# Patient Record
Sex: Female | Born: 1945 | Race: White | Hispanic: No | Marital: Married | Smoking: Never smoker
Health system: Southern US, Community
[De-identification: ages and names within clinical notes are randomized; demographics above are authoritative.]

## PROBLEM LIST (undated history)

## (undated) DIAGNOSIS — M1990 Unspecified osteoarthritis, unspecified site: Secondary | ICD-10-CM

## (undated) DIAGNOSIS — E785 Hyperlipidemia, unspecified: Secondary | ICD-10-CM

## (undated) DIAGNOSIS — H269 Unspecified cataract: Secondary | ICD-10-CM

## (undated) DIAGNOSIS — I10 Essential (primary) hypertension: Secondary | ICD-10-CM

## (undated) DIAGNOSIS — M8580 Other specified disorders of bone density and structure, unspecified site: Secondary | ICD-10-CM

## (undated) DIAGNOSIS — M810 Age-related osteoporosis without current pathological fracture: Secondary | ICD-10-CM

## (undated) HISTORY — DX: Age-related osteoporosis without current pathological fracture: M81.0

## (undated) HISTORY — DX: Other specified disorders of bone density and structure, unspecified site: M85.80

## (undated) HISTORY — DX: Unspecified osteoarthritis, unspecified site: M19.90

## (undated) HISTORY — PX: EYE SURGERY: SHX253

## (undated) HISTORY — PX: CATARACT EXTRACTION: SUR2

## (undated) HISTORY — DX: Unspecified cataract: H26.9

## (undated) HISTORY — PX: TUBAL LIGATION: SHX77

## (undated) HISTORY — DX: Essential (primary) hypertension: I10

## (undated) HISTORY — DX: Hyperlipidemia, unspecified: E78.5

---

## 1999-08-08 ENCOUNTER — Other Ambulatory Visit: Admission: RE | Admit: 1999-08-08 | Discharge: 1999-08-08 | Payer: Self-pay | Admitting: Obstetrics and Gynecology

## 1999-08-08 ENCOUNTER — Encounter (INDEPENDENT_AMBULATORY_CARE_PROVIDER_SITE_OTHER): Payer: Self-pay | Admitting: Specialist

## 2001-10-30 ENCOUNTER — Other Ambulatory Visit: Admission: RE | Admit: 2001-10-30 | Discharge: 2001-10-30 | Payer: Self-pay | Admitting: Obstetrics and Gynecology

## 2002-11-10 ENCOUNTER — Other Ambulatory Visit: Admission: RE | Admit: 2002-11-10 | Discharge: 2002-11-10 | Payer: Self-pay | Admitting: Obstetrics and Gynecology

## 2003-11-26 ENCOUNTER — Other Ambulatory Visit: Admission: RE | Admit: 2003-11-26 | Discharge: 2003-11-26 | Payer: Self-pay | Admitting: Obstetrics and Gynecology

## 2004-12-07 ENCOUNTER — Other Ambulatory Visit: Admission: RE | Admit: 2004-12-07 | Discharge: 2004-12-07 | Payer: Self-pay | Admitting: Obstetrics and Gynecology

## 2005-08-23 ENCOUNTER — Ambulatory Visit: Payer: Self-pay | Admitting: Internal Medicine

## 2005-09-03 ENCOUNTER — Ambulatory Visit: Payer: Self-pay | Admitting: Internal Medicine

## 2008-09-22 ENCOUNTER — Ambulatory Visit: Payer: Self-pay | Admitting: Family Medicine

## 2010-06-07 ENCOUNTER — Telehealth: Payer: Self-pay | Admitting: Family Medicine

## 2010-06-20 ENCOUNTER — Ambulatory Visit: Payer: Self-pay | Admitting: Family Medicine

## 2010-08-16 ENCOUNTER — Encounter: Payer: Self-pay | Admitting: Family Medicine

## 2010-08-21 ENCOUNTER — Encounter (INDEPENDENT_AMBULATORY_CARE_PROVIDER_SITE_OTHER): Payer: Self-pay | Admitting: *Deleted

## 2010-10-12 ENCOUNTER — Ambulatory Visit
Admission: RE | Admit: 2010-10-12 | Discharge: 2010-10-12 | Payer: Self-pay | Source: Home / Self Care | Attending: Family Medicine | Admitting: Family Medicine

## 2010-11-07 NOTE — Miscellaneous (Signed)
Summary: Immunization Entry   Immunization History:  Influenza Immunization History:    Influenza:  historical @ [facility] (08/16/2010)

## 2010-11-07 NOTE — Assessment & Plan Note (Signed)
Summary: SHINGLES SHOT///SPH  Nurse Visit   Allergies: No Known Drug Allergies  Immunizations Administered:  Zostavax # 1:    Vaccine Type: Zostavax    Site: right deltoid    Mfr: Merck    Dose: 0.5 ml    Route: [place]    Given by: Almeta Monas CMA (AAMA)    Exp. Date: 05/26/2011    Lot #: [id number]    VIS given: 07/20/05 given June 20, 2010.  Orders Added: 1)  Zoster (Shingles) Vaccine Live [90736] 2)  Admin 1st Vaccine [phone]

## 2010-11-07 NOTE — Progress Notes (Signed)
Summary: Rx for Shingles Shot  Phone Note Call from Patient Call back at Home Phone [phone]   Caller: Patient Summary of Call: pt is requesting an rx for a shingles shot. [facility].  Initial call taken by: Lavell Islam,  June 07, 2010 4:14 PM  Follow-up for Phone Call        please advise. Lucious Groves CMA  June 08, 2010 9:27 AM   Additional Follow-up for Phone Call Additional follow up Details #1::        sent to pharmacy Additional Follow-up by: Loreen Freud DO,  June 08, 2010 10:20 AM    Additional Follow-up for Phone Call Additional follow up Details #2::    Patient spouse notified that prescription has been taken care of. Follow-up by: Lucious Groves CMA,  June 08, 2010 11:08 AM  New/Updated Medications: ZOSTAVAX 09811 UNT/0.65ML SOLR (ZOSTER VACCINE LIVE) 1 ml IM x1 Prescriptions: ZOSTAVAX 91478 UNT/0.65ML SOLR (ZOSTER VACCINE LIVE) 1 ml IM x1  #1 x 0   Entered and Authorized by:   Loreen Freud DO   Signed by:   Loreen Freud DO on 06/08/2010   Method used:   Electronically to        [id number]* (retail)       [address]       [place], [place]  [address]       Ph: [phone]       Fax: [phone]   RxID:   [id number]

## 2010-11-07 NOTE — Miscellaneous (Signed)
Summary: [facility]  [facility]   Imported By: Lanelle Bal 08/29/2010 10:20:45  _____________________________________________________________________  External Attachment:    Type:   Image     Comment:   External Document

## 2010-11-09 NOTE — Assessment & Plan Note (Signed)
Summary: sore tendon-left shoulder-///sph   Vital Signs:  Patient profile:   65 year old female Height:      63 inches Weight:      134.4 pounds BMI:     23.89 Pulse rate:   72 / minute Pulse rhythm:   regular BP sitting:   108 / 66  (right arm) Cuff size:   regular  Vitals Entered By: Almeta Monas CMA Duncan Dull) (October 12, 2010 11:30 AM) CC: c/o sore left shoulder tendon   History of Present Illness: Pt here c/o L arm pain since October after moving some suitcases on a trip.   No other injuries.  No other symptoms.      Current Medications (verified): 1)  Zostavax 16109 Unt/0.87ml Solr (Zoster Vaccine Live) .Marland Kitchen.. 1 Ml Im X1 2)  Mobic 15 Mg Tabs (Meloxicam) .Marland Kitchen.. 1 By Mouth Once Daily As Needed Pain  Allergies (verified): No Known Drug Allergies  Review of Systems      See HPI  Physical Exam  General:  Well-developed,well-nourished,in no acute distress; alert,appropriate and cooperative throughout examination Msk:  L shoulder--pain with abduction past 90 degrees but pt is able to do it no swelling + tenderness lat upper arm --deltoid Extremities:  No clubbing, cyanosis, edema, or deformity noted with normal full range of motion of all joints.   Psych:  Oriented X3 and normally interactive.     Impression & Recommendations:  Problem # 1:  SHOULDER PAIN, LEFT (ICD-719.41)  The following medications were removed from the medication list:    Mobic 15 Mg Tabs (Meloxicam) .Marland Kitchen... 1 by mouth once daily prn Her updated medication list for this problem includes:    Mobic 15 Mg Tabs (Meloxicam) .Marland Kitchen... 1 by mouth once daily as needed pain  Discussed shoulder exercises, use of moist heat or ice, and medication.   Complete Medication List: 1)  Zostavax 60454 Unt/0.71ml Solr (Zoster vaccine live) .Marland Kitchen.. 1 ml im x1 2)  Mobic 15 Mg Tabs (Meloxicam) .Marland Kitchen.. 1 by mouth once daily as needed pain  Patient Instructions: 1)  RTO Cpe Prescriptions: MOBIC 15 MG TABS (MELOXICAM) 1 by mouth  once daily as needed pain  #30 x 2   Entered and Authorized by:   Loreen Freud DO   Signed by:   Loreen Freud DO on 10/12/2010   Method used:   Electronically to        [id number]* (retail)       [address]       [place], [place]  [address]       Ph: [phone]       Fax: [phone]   RxID:   [id number]    Orders Added: 1)  Est. Patient Level III [66440]

## 2011-04-16 ENCOUNTER — Other Ambulatory Visit: Payer: Self-pay | Admitting: Family Medicine

## 2011-04-16 NOTE — Telephone Encounter (Signed)
Per [facility] pt has been seen for acute items. I do not see a CPX. Please advise.

## 2011-04-17 NOTE — Telephone Encounter (Signed)
Refill x1 only Pt needs ov/ cpe

## 2011-04-17 NOTE — Telephone Encounter (Signed)
Letter mailed     KP 

## 2011-08-20 ENCOUNTER — Encounter: Payer: Self-pay | Admitting: Family Medicine

## 2011-08-27 ENCOUNTER — Encounter: Payer: Self-pay | Admitting: Family Medicine

## 2011-08-28 ENCOUNTER — Ambulatory Visit (INDEPENDENT_AMBULATORY_CARE_PROVIDER_SITE_OTHER): Payer: BC Managed Care – PPO | Admitting: Family Medicine

## 2011-08-28 ENCOUNTER — Encounter: Payer: Self-pay | Admitting: Family Medicine

## 2011-08-28 VITALS — BP 108/70 | HR 68 | Temp 98.2°F | Ht 67.0 in | Wt 131.4 lb

## 2011-08-28 DIAGNOSIS — Z23 Encounter for immunization: Secondary | ICD-10-CM

## 2011-08-28 DIAGNOSIS — Z0000 Encounter for general adult medical examination without abnormal findings: Secondary | ICD-10-CM

## 2011-08-28 LAB — TSH: TSH: 0.92 u[IU]/mL (ref 0.35–5.50)

## 2011-08-28 LAB — LIPID PANEL: Triglycerides: 94 mg/dL (ref 0.0–149.0)

## 2011-08-28 LAB — HEPATIC FUNCTION PANEL
ALT: 14 U/L (ref 0–35)
AST: 16 U/L (ref 0–37)
Alkaline Phosphatase: 48 U/L (ref 39–117)
Bilirubin, Direct: 0 mg/dL (ref 0.0–0.3)
Total Protein: 7.8 g/dL (ref 6.0–8.3)

## 2011-08-28 LAB — POCT URINALYSIS DIPSTICK
Bilirubin, UA: NEGATIVE
Clarity, UA: NEGATIVE
Glucose, UA: NEGATIVE
Ketones, UA: NEGATIVE
Leukocytes, UA: NEGATIVE
Nitrite, UA: NEGATIVE

## 2011-08-28 LAB — CBC WITH DIFFERENTIAL/PLATELET
Basophils Relative: 0.3 % (ref 0.0–3.0)
Eosinophils Relative: 2.4 % (ref 0.0–5.0)
HCT: 41.3 % (ref 36.0–46.0)
MCV: 93.9 fl (ref 78.0–100.0)
Monocytes Absolute: 0.7 10*3/uL (ref 0.1–1.0)
Monocytes Relative: 7.8 % (ref 3.0–12.0)
Neutrophils Relative %: 61.9 % (ref 43.0–77.0)
RBC: 4.4 Mil/uL (ref 3.87–5.11)
WBC: 9.2 10*3/uL (ref 4.5–10.5)

## 2011-08-28 LAB — BASIC METABOLIC PANEL
Chloride: 106 mEq/L (ref 96–112)
Creatinine, Ser: 0.7 mg/dL (ref 0.4–1.2)
Potassium: 3.6 mEq/L (ref 3.5–5.1)

## 2011-08-28 NOTE — Progress Notes (Signed)
  Subjective:     Tonya Sheppard is a 65 y.o. female and is here for a comprehensive physical exam. The patient reports no problems.  History   Social History  . Marital Status: Single    Spouse Name: N/A    Number of Children: N/A  . Years of Education: N/A   Occupational History  . Not on file.   Social History Main Topics  . Smoking status: Never Smoker   . Smokeless tobacco: Never Used  . Alcohol Use: No  . Drug Use: No  . Sexually Active: Not on file   Other Topics Concern  . Not on file   Social History Narrative  . No narrative on file   Health Maintenance  Topic Date Due  . Pap Smear  09/26/1964  . Tetanus/tdap  09/26/1965  . Mammogram  09/26/1996  . Colonoscopy  09/26/1996  . Influenza Vaccine  07/08/2012  . Zostavax  Completed    The following portions of the patient's history were reviewed and updated as appropriate: allergies, current medications, past family history, past medical history, past social history, past surgical history and problem list.  Review of Systems Review of Systems  Constitutional: Negative for activity change, appetite change and fatigue.  HENT: Negative for hearing loss, congestion, tinnitus and ear discharge.  dentist q49m Eyes: Negative for visual disturbance (see optho q2y -- vision corrected to 20/20 with glasses).  Respiratory: Negative for cough, chest tightness and shortness of breath.   Cardiovascular: Negative for chest pain, palpitations and leg swelling.  Gastrointestinal: Negative for abdominal pain, diarrhea, constipation and abdominal distention.  Genitourinary: Negative for urgency, frequency, decreased urine volume and difficulty urinating.  Musculoskeletal: Negative for back pain, arthralgias and gait problem.  Skin: Negative for color change, pallor and rash.  Neurological: Negative for dizziness, light-headedness, numbness and headaches.  Hematological: Negative for adenopathy. Does not bruise/bleed easily.    Psychiatric/Behavioral: Negative for suicidal ideas, confusion, sleep disturbance, self-injury, dysphoric mood, decreased concentration and agitation.       Objective:    BP 108/70  Pulse 68  Temp(Src) 98.2 F (36.8 C) (Oral)  Ht 5\' 7"  (1.702 m)  Wt 131 lb 6.4 oz (59.603 kg)  BMI 20.58 kg/m2  SpO2 98% General appearance: alert, cooperative, appears stated age and no distress Head: Normocephalic, without obvious abnormality, atraumatic Eyes: conjunctivae/corneas clear. PERRL, EOM's intact. Fundi benign. Ears: normal TM's and external ear canals both ears Nose: Nares normal. Septum midline. Mucosa normal. No drainage or sinus tenderness. Throat: lips, mucosa, and tongue normal; teeth and gums normal Neck: no adenopathy, no carotid bruit, no JVD, supple, symmetrical, trachea midline and thyroid not enlarged, symmetric, no tenderness/mass/nodules Back: symmetric, no curvature. ROM normal. No CVA tenderness. Lungs: clear to auscultation bilaterally Breasts: normal appearance, no masses or tenderness Heart: regular rate and rhythm, S1, S2 normal, no murmur, click, rub or gallop Abdomen: soft, non-tender; bowel sounds normal; no masses,  no organomegaly Pelvic: gyn Extremities: extremities normal, atraumatic, no cyanosis or edema Pulses: 2+ and symmetric Skin: Skin color, texture, turgor normal. No rashes or lesions Lymph nodes: Cervical, supraclavicular, and axillary nodes normal. Neurologic: Alert and oriented X 3, normal strength and tone. Normal symmetric reflexes. Normal coordination and gait psych-- aaox3,  no anxiety or depression    Assessment:    Healthy female exam.  Arthritis---mobic prn     Plan:    ghm utd See After Visit Summary for Counseling Recommendations

## 2011-08-28 NOTE — Patient Instructions (Signed)

## 2011-12-25 ENCOUNTER — Other Ambulatory Visit: Payer: Self-pay | Admitting: Family Medicine

## 2011-12-26 NOTE — Telephone Encounter (Signed)
Last seen 08/28/11 and filled 04/16/11 # 30. Please advise   KP

## 2012-01-30 ENCOUNTER — Other Ambulatory Visit: Payer: Self-pay | Admitting: Family Medicine

## 2012-01-30 NOTE — Telephone Encounter (Signed)
Last seen 08/28/2011 and filled 12/25/11 # 30. Please advise    KP

## 2012-02-11 NOTE — Telephone Encounter (Signed)
2nd request:  Meloxicam 15mg  tablet #30. Take 1 tablet by mouth daily with food if needed for pain. Last fill 12-26-11

## 2012-02-11 NOTE — Telephone Encounter (Signed)
Last seen 08/28/11 and filled 12/25/11 # 30. please advise    KP

## 2012-09-17 ENCOUNTER — Encounter: Payer: Self-pay | Admitting: Family Medicine

## 2012-09-17 ENCOUNTER — Ambulatory Visit (HOSPITAL_BASED_OUTPATIENT_CLINIC_OR_DEPARTMENT_OTHER)
Admission: RE | Admit: 2012-09-17 | Discharge: 2012-09-17 | Disposition: A | Discharge status: Home / Self Care | Payer: Medicare Other | Source: Ambulatory Visit | Attending: Family Medicine | Admitting: Family Medicine

## 2012-09-17 ENCOUNTER — Ambulatory Visit (INDEPENDENT_AMBULATORY_CARE_PROVIDER_SITE_OTHER): Payer: BC Managed Care – PPO | Admitting: Family Medicine

## 2012-09-17 VITALS — BP 160/90 | HR 63 | Temp 97.6°F | Ht 67.0 in | Wt 134.2 lb

## 2012-09-17 DIAGNOSIS — M25569 Pain in unspecified knee: Secondary | ICD-10-CM

## 2012-09-17 MED ORDER — MELOXICAM 15 MG PO TABS: 15.0000 mg | ORAL_TABLET | Freq: Every day | ORAL | Status: DC

## 2012-09-17 NOTE — Assessment & Plan Note (Signed)
Due to pt's report of seeing/feeling a cord and recent long car ride will get [place] to r/o DVT.  If [place] negative, pt's pain is most likely soft tissue as she has minimal bony/joint tenderness.  Start scheduled NSAIDs, tylenol prn as this provided good relief, ice.  If no improvement in 1 week, will refer to ortho.  Reviewed supportive care and red flags that should prompt return.  Pt expressed understanding and is in agreement w/ plan.

## 2012-09-17 NOTE — Progress Notes (Signed)
  Subjective:    Patient ID: Tonya Sheppard, female    DOB: 1946-09-28, 66 y.o.   MRN: [id number]  HPI Knee pain- L knee, started acutely yesterday.  Has had mild discomfort ~1 month.  Woke up yesterday and 'was ok' and then 'it was all of a sudden, oh my god'.  Pain is a burning pain.  Pain is medial and inferior to knee cap.  Pain is worse w/ weight bearing.  Painful to touch- 'the seam on my pjs made it worse'.  Pain improved w/ tylenol.  Painful to bend knee.  Area appears to be bruised.  No known injury but had car ride w/ leg in stationary position.  Has never had gout.   Review of Systems For ROS see HPI     Objective:   Physical Exam  Vitals reviewed. Constitutional: She is oriented to person, place, and time. She appears well-developed and well-nourished.       Obviously uncomfortable  Musculoskeletal:       Left knee: She exhibits swelling. She exhibits normal range of motion, no effusion, no ecchymosis, no deformity and no erythema. tenderness found. Medial joint line tenderness noted. No lateral joint line and no patellar tendon tenderness noted.       Legs: Neurological: She is alert and oriented to person, place, and time. She has normal reflexes. Coordination (antalgic gait) normal.          Assessment & Plan:

## 2012-09-17 NOTE — Patient Instructions (Addendum)
We'll notify you of your ultrasound results Start the Mobic daily Continue tylenol as needed ICE! Call if no improvement in 1 week- we'll then send you to ortho Call with any questions or concerns Hang in there!

## 2012-09-19 ENCOUNTER — Other Ambulatory Visit: Payer: Self-pay | Admitting: Family Medicine

## 2012-09-19 ENCOUNTER — Telehealth: Payer: Self-pay | Admitting: Family Medicine

## 2012-09-19 DIAGNOSIS — M79606 Pain in leg, unspecified: Secondary | ICD-10-CM

## 2012-09-19 NOTE — Telephone Encounter (Signed)
Patient made aware and voiced undertsanding.     KP

## 2012-09-19 NOTE — Telephone Encounter (Signed)
Assessment & Plan:    Knee pain - Tonya Rhymes, MD 09/17/2012 4:42 PM Signed  Due to pt's report of seeing/feeling a cord and recent long car ride will get [place] to r/o DVT. If [place] negative, pt's pain is most likely soft tissue as she has minimal bony/joint tenderness. Start scheduled NSAIDs, tylenol prn as this provided good relief, ice. If no improvement in 1 week, will refer to ortho. Reviewed supportive care and red flags that should prompt return. Pt expressed understanding and is in agreement w/ plan.    Patient does not want to wait a week, she would like referral now. Please advise     KP

## 2012-09-19 NOTE — Telephone Encounter (Signed)
Referral in

## 2012-09-19 NOTE — Telephone Encounter (Signed)
Pt does not want to wait for the time period that Lowne told her to see if she got better due to the long wait of getting a referral. So she would like to get that referral now so that she doesn't have to wait so long. pls call pt

## 2012-09-23 ENCOUNTER — Other Ambulatory Visit (INDEPENDENT_AMBULATORY_CARE_PROVIDER_SITE_OTHER): Payer: Medicare Other

## 2012-09-23 DIAGNOSIS — M899 Disorder of bone, unspecified: Secondary | ICD-10-CM

## 2012-09-30 ENCOUNTER — Encounter: Payer: Self-pay | Admitting: *Deleted

## 2012-10-30 ENCOUNTER — Other Ambulatory Visit (INDEPENDENT_AMBULATORY_CARE_PROVIDER_SITE_OTHER): Payer: Medicare Other

## 2012-10-30 DIAGNOSIS — M899 Disorder of bone, unspecified: Secondary | ICD-10-CM

## 2012-10-30 DIAGNOSIS — M949 Disorder of cartilage, unspecified: Secondary | ICD-10-CM

## 2012-10-30 LAB — CREATININE, SERUM: Creatinine, Ser: 0.8 mg/dL (ref 0.4–1.2)

## 2012-10-30 LAB — CALCIUM: Calcium: 9.4 mg/dL (ref 8.4–10.5)

## 2012-11-22 ENCOUNTER — Other Ambulatory Visit: Payer: Self-pay

## 2013-05-13 ENCOUNTER — Other Ambulatory Visit: Payer: Self-pay

## 2013-07-03 LAB — HM MAMMOGRAPHY: HM Mammogram: NEGATIVE

## 2013-07-23 ENCOUNTER — Other Ambulatory Visit: Payer: Self-pay | Admitting: Family Medicine

## 2013-07-28 ENCOUNTER — Ambulatory Visit: Payer: Medicare Other

## 2013-08-13 ENCOUNTER — Other Ambulatory Visit: Payer: Self-pay

## 2013-09-25 ENCOUNTER — Encounter: Payer: Medicare Other | Admitting: Family Medicine

## 2013-12-24 ENCOUNTER — Encounter: Payer: Self-pay | Admitting: Family Medicine

## 2013-12-24 ENCOUNTER — Ambulatory Visit (INDEPENDENT_AMBULATORY_CARE_PROVIDER_SITE_OTHER): Payer: Medicare HMO | Admitting: Family Medicine

## 2013-12-24 VITALS — BP 130/70 | HR 62 | Temp 97.6°F | Ht 67.0 in | Wt 135.6 lb

## 2013-12-24 DIAGNOSIS — Z0000 Encounter for general adult medical examination without abnormal findings: Secondary | ICD-10-CM

## 2013-12-24 DIAGNOSIS — Z136 Encounter for screening for cardiovascular disorders: Secondary | ICD-10-CM

## 2013-12-24 DIAGNOSIS — Z23 Encounter for immunization: Secondary | ICD-10-CM

## 2013-12-24 NOTE — Patient Instructions (Signed)

## 2013-12-24 NOTE — Progress Notes (Signed)
Subjective:    Tonya Sheppard is a 68 y.o. female who presents for Medicare Annual/Subsequent preventive examination.  Preventive Screening-Counseling & Management  Tobacco History  Smoking status  . Never Smoker   Smokeless tobacco  . Never Used     Problems Prior to Visit 1.   Current Problems (verified) There are no active problems to display for this patient.   Medications Prior to Visit Current Outpatient Prescriptions on File Prior to Visit  Medication Sig Dispense Refill  . Calcium Carb-Cholecalciferol (CALCIUM 1000 + D PO) Take 1,000 mg by mouth 2 (two) times daily.      . meloxicam (MOBIC) 15 MG tablet Take 1 tablet (15 mg total) by mouth daily.  30 tablet  3   No current facility-administered medications on file prior to visit.    Current Medications (verified) Current Outpatient Prescriptions  Medication Sig Dispense Refill  . alendronate (FOSAMAX) 70 MG tablet       . Calcium Carb-Cholecalciferol (CALCIUM 1000 + D PO) Take 1,000 mg by mouth 2 (two) times daily.      . meloxicam (MOBIC) 15 MG tablet Take 1 tablet (15 mg total) by mouth daily.  30 tablet  3   No current facility-administered medications for this visit.     Allergies (verified) Review of patient's allergies indicates no known allergies.   PAST HISTORY  Family History Family History  Problem Relation Age of Onset  . Hypertension Mother   . Heart disease Mother   . Heart disease Father 77    MI  . Cancer Sister 78    ovarian  . Hepatitis Brother   . Alcohol abuse Brother   . Alcohol abuse Sister     Social History History  Substance Use Topics  . Smoking status: Never Smoker   . Smokeless tobacco: Never Used  . Alcohol Use: No     Are there smokers in your home (other than you)? No  Risk Factors Current exercise habits: Gym/ health club routine includes cardio and yard work.  Dietary issues discussed: na    Cardiac risk factors: none.  Depression Screen (Note: if  answer to either of the following is "Yes", a more complete depression screening is indicated)   Over the past two weeks, have you felt down, depressed or hopeless? No  Over the past two weeks, have you felt little interest or pleasure in doing things? No  Have you lost interest or pleasure in daily life? No  Do you often feel hopeless? No  Do you cry easily over simple problems? No  Activities of Daily Living In your present state of health, do you have any difficulty performing the following activities?:  Driving? No Managing money?  No Feeding yourself? No Getting from bed to chair? No Climbing a flight of stairs? No Preparing food and eating?: No Bathing or showering? No Getting dressed: No Getting to the toilet? No Using the toilet:No Moving around from place to place: No In the past year have you fallen or had a near fall?:No   Are you sexually active?  Yes  Do you have more than one partner?  No  Hearing Difficulties: No Do you often ask people to speak up or repeat themselves? No Do you experience ringing or noises in your ears? No Do you have difficulty understanding soft or whispered voices? No   Do you feel that you have a problem with memory? No  Do you often misplace items? No  Do you  feel safe at home?  Yes  Cognitive Testing  Alert? Yes  Normal Appearance?Yes  Oriented to person? Yes  Place? Yes   Time? Yes  Recall of three objects?  Yes  Can perform simple calculations? Yes  Displays appropriate judgment?Yes  Can read the correct time from a watch face?Yes   Advanced Directives have been discussed with the patient? Yes  List the Names of Other Physician/Practitioners you currently use: 1.    Indicate any recent Medical Services you may have received from other than [facility] providers in the past year (date may be approximate).  Immunization History  Administered Date(s) Administered  . Influenza Whole 08/16/2010, 07/27/2011  . Influenza-Unspecified  06/08/2013  . Tdap 08/28/2011  . Zoster 06/20/2010    Screening Tests Health Maintenance  Topic Date Due  . Pneumococcal Polysaccharide Vaccine Age 68 And Over  09/27/2011  . Influenza Vaccine  05/08/2014  . Mammogram  07/04/2015  . Colonoscopy  08/27/2016  . Tetanus/tdap  08/27/2021  . Zostavax  Completed    All answers were reviewed with the patient and necessary referrals were made:  Tonya FreudYvonne Lowne, DO   12/24/2013   History reviewed:  She  has no past medical history on file. She  does not have any pertinent problems on file. She  has no past surgical history on file. Her family history includes Alcohol abuse in her brother and sister; Cancer (age of onset: 750) in her sister; Heart disease in her mother; Heart disease (age of onset: 2579) in her father; Hepatitis in her brother; Hypertension in her mother. She  reports that she has never smoked. She has never used smokeless tobacco. She reports that she does not drink alcohol or use illicit drugs. She has a current medication list which includes the following prescription(s): alendronate, calcium carb-cholecalciferol, and meloxicam. Current Outpatient Prescriptions on File Prior to Visit  Medication Sig Dispense Refill  . Calcium Carb-Cholecalciferol (CALCIUM 1000 + D PO) Take 1,000 mg by mouth 2 (two) times daily.      . meloxicam (MOBIC) 15 MG tablet Take 1 tablet (15 mg total) by mouth daily.  30 tablet  3   No current facility-administered medications on file prior to visit.   She has No Known Allergies.  Review of Systems  Review of Systems  Constitutional: Negative for activity change, appetite change and fatigue.  HENT: Negative for hearing loss, congestion, tinnitus and ear discharge.   Eyes: Negative for visual disturbance (see optho q1y -- vision corrected to 20/20 with glasses).  Respiratory: Negative for cough, chest tightness and shortness of breath.   Cardiovascular: Negative for chest pain, palpitations and  leg swelling.  Gastrointestinal: Negative for abdominal pain, diarrhea, constipation and abdominal distention.  Genitourinary: Negative for urgency, frequency, decreased urine volume and difficulty urinating.  Musculoskeletal: Negative for back pain, arthralgias and gait problem.  Skin: Negative for color change, pallor and rash.  Neurological: Negative for dizziness, light-headedness, numbness and headaches.  Hematological: Negative for adenopathy. Does not bruise/bleed easily.  Psychiatric/Behavioral: Negative for suicidal ideas, confusion, sleep disturbance, self-injury, dysphoric mood, decreased concentration and agitation.  Pt is able to read and write and can do all ADLs No risk for falling No abuse/ violence in home     Objective:     Vision by Snellen chart: opth  Body mass index is 21.23 kg/(m^2). BP 130/70  Pulse 62  Temp(Src) 97.6 F (36.4 C) (Oral)  Ht 5\' 7"  (1.702 m)  Wt 135 lb 9.6 oz (61.508  kg)  BMI 21.23 kg/m2  SpO2 98%  BP 130/70  Pulse 62  Temp(Src) 97.6 F (36.4 C) (Oral)  Ht 5\' 7"  (1.702 m)  Wt 135 lb 9.6 oz (61.508 kg)  BMI 21.23 kg/m2  SpO2 98% General appearance: alert, cooperative, appears stated age and no distress Head: Normocephalic, without obvious abnormality, atraumatic Eyes: conjunctivae/corneas clear. PERRL, EOM's intact. Fundi benign. Ears: normal TM's and external ear canals both ears Nose: Nares normal. Septum midline. Mucosa normal. No drainage or sinus tenderness. Throat: lips, mucosa, and tongue normal; teeth and gums normal Neck: no adenopathy, no carotid bruit, no JVD, supple, symmetrical, trachea midline and thyroid not enlarged, symmetric, no tenderness/mass/nodules Back: symmetric, no curvature. ROM normal. No CVA tenderness. Lungs: clear to auscultation bilaterally Breasts: gyn Heart: S1, S2 normal Abdomen: soft, non-tender; bowel sounds normal; no masses,  no organomegaly Pelvic: deferred--gyn Extremities: extremities  normal, atraumatic, no cyanosis or edema Pulses: 2+ and symmetric Skin: Skin color, texture, turgor normal. No rashes or lesions Lymph nodes: Cervical, supraclavicular, and axillary nodes normal. Neurologic: Alert and oriented X 3, normal strength and tone. Normal symmetric reflexes. Normal coordination and gait Psych--normal      Assessment:     cpe      Plan:     During the course of the visit the patient was educated and counseled about appropriate screening and preventive services including:    Pneumococcal vaccine   Influenza vaccine  Td vaccine  Screening mammography  Bone densitometry screening  Colorectal cancer screening  Glaucoma screening  Advanced directives: has an advanced directive - a copy HAS NOT been provided.  Diet review for nutrition referral? Yes ____  Not Indicated x____   Patient Instructions (the written plan) was given to the patient.  Medicare Attestation I have personally reviewed: The patient's medical and social history Their use of alcohol, tobacco or illicit drugs Their current medications and supplements The patient's functional ability including ADLs,fall risks, home safety risks, cognitive, and hearing and visual impairment Diet and physical activities Evidence for depression or mood disorders  The patient's weight, height, BMI, and visual acuity have been recorded in the chart.  I have made referrals, counseling, and provided education to the patient based on review of the above and I have provided the patient with a written personalized care plan for preventive services.     Tonya Freud, DO   12/24/2013   1. Medicare annual wellness visit, subsequent   2. Screening, ischemic heart disease  - Basic metabolic panel; Future - CBC with Differential; Future - Hepatic function panel; Future - Lipid panel; Future - POCT urinalysis dipstick; Future  3. Need for pneumococcal vaccination  - Pneumococcal polysaccharide  vaccine 23-valent greater than or equal to 2yo subcutaneous/IM

## 2013-12-24 NOTE — Progress Notes (Signed)
Pre visit review using our clinic review tool, if applicable. No additional management support is needed unless otherwise documented below in the visit note. 

## 2013-12-31 ENCOUNTER — Other Ambulatory Visit (INDEPENDENT_AMBULATORY_CARE_PROVIDER_SITE_OTHER): Payer: Medicare HMO

## 2013-12-31 DIAGNOSIS — Z136 Encounter for screening for cardiovascular disorders: Secondary | ICD-10-CM

## 2013-12-31 LAB — LIPID PANEL
CHOL/HDL RATIO: 3
Cholesterol: 239 mg/dL — ABNORMAL HIGH (ref 0–200)
HDL: 78.9 mg/dL (ref >39.00)
LDL Cholesterol: 147 mg/dL — ABNORMAL HIGH (ref 0–99)
Triglycerides: 66 mg/dL (ref 0.0–149.0)
VLDL: 13.2 mg/dL (ref 0.0–40.0)

## 2013-12-31 LAB — CBC WITH DIFFERENTIAL/PLATELET
BASOS PCT: 0.6 % (ref 0.0–3.0)
Basophils Absolute: 0 10*3/uL (ref 0.0–0.1)
EOS PCT: 1.7 % (ref 0.0–5.0)
Eosinophils Absolute: 0.1 10*3/uL (ref 0.0–0.7)
HCT: 40 % (ref 36.0–46.0)
HEMOGLOBIN: 13.2 g/dL (ref 12.0–15.0)
LYMPHS PCT: 31.1 % (ref 12.0–46.0)
Lymphs Abs: 2.4 10*3/uL (ref 0.7–4.0)
MCHC: 32.9 g/dL (ref 30.0–36.0)
MCV: 93.2 fl (ref 78.0–100.0)
MONOS PCT: 8.5 % (ref 3.0–12.0)
Monocytes Absolute: 0.7 10*3/uL (ref 0.1–1.0)
NEUTROS ABS: 4.5 10*3/uL (ref 1.4–7.7)
NEUTROS PCT: 58.1 % (ref 43.0–77.0)
Platelets: 272 10*3/uL (ref 150.0–400.0)
RBC: 4.3 Mil/uL (ref 3.87–5.11)
RDW: 13.9 % (ref 11.5–14.6)
WBC: 7.8 10*3/uL (ref 4.5–10.5)

## 2013-12-31 LAB — HEPATIC FUNCTION PANEL
ALBUMIN: 4.4 g/dL (ref 3.5–5.2)
ALK PHOS: 44 U/L (ref 39–117)
ALT: 12 U/L (ref 0–35)
AST: 16 U/L (ref 0–37)
BILIRUBIN DIRECT: 0 mg/dL (ref 0.0–0.3)
TOTAL PROTEIN: 7.6 g/dL (ref 6.0–8.3)
Total Bilirubin: 0.6 mg/dL (ref 0.3–1.2)

## 2013-12-31 LAB — POCT URINALYSIS DIPSTICK
BILIRUBIN UA: NEGATIVE
Blood, UA: NEGATIVE
GLUCOSE UA: NEGATIVE
KETONES UA: NEGATIVE
LEUKOCYTES UA: NEGATIVE
Nitrite, UA: NEGATIVE
Protein, UA: NEGATIVE
Spec Grav, UA: <=1.005
Urobilinogen, UA: 0.2
pH, UA: 6

## 2013-12-31 LAB — BASIC METABOLIC PANEL
BUN: 14 mg/dL (ref 6–23)
CHLORIDE: 103 meq/L (ref 96–112)
CO2: 26 mEq/L (ref 19–32)
CREATININE: 0.7 mg/dL (ref 0.4–1.2)
Calcium: 9.2 mg/dL (ref 8.4–10.5)
GFR: 93.24 mL/min (ref >60.00)
Glucose, Bld: 97 mg/dL (ref 70–99)
Potassium: 3.4 mEq/L — ABNORMAL LOW (ref 3.5–5.1)
SODIUM: 137 meq/L (ref 135–145)

## 2014-04-07 IMAGING — US US EXTREM LOW VENOUS*L*
1 series · 14 of 22 positions shown · non-contrast
Comparison: None

CLINICAL DATA: Left knee pain and swelling after a long car trip

LEFT LOWER EXTREMITY VENOUS DUPLEX ULTRASOUND
TECHNIQUE: Gray-scale sonography with graded compression, as well
as color Doppler and duplex ultrasound, were performed to evaluate
the deep venous system of the lower extremity from the level of the
common femoral vein through the popliteal and proximal calf veins.
Spectral Doppler was utilized to evaluate flow at rest and with
distal augmentation maneuvers.

[Series 1: us extrem low venous*left* · 14 of 22 slices shown]
[im 1/22]
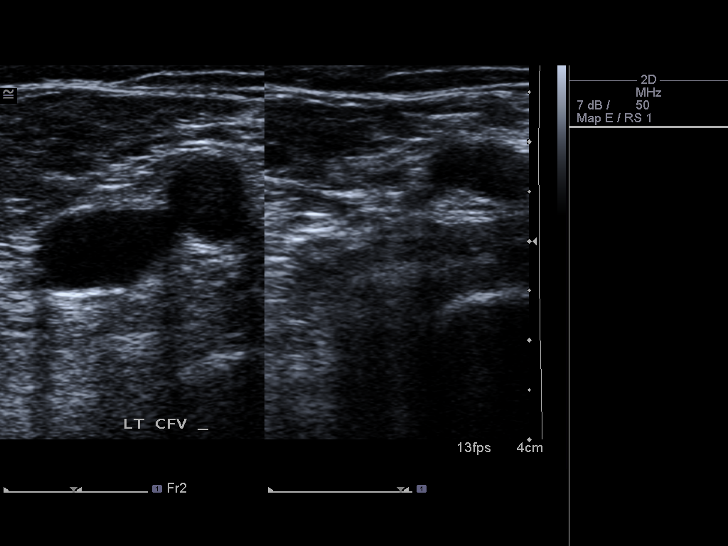
[im 3/22]
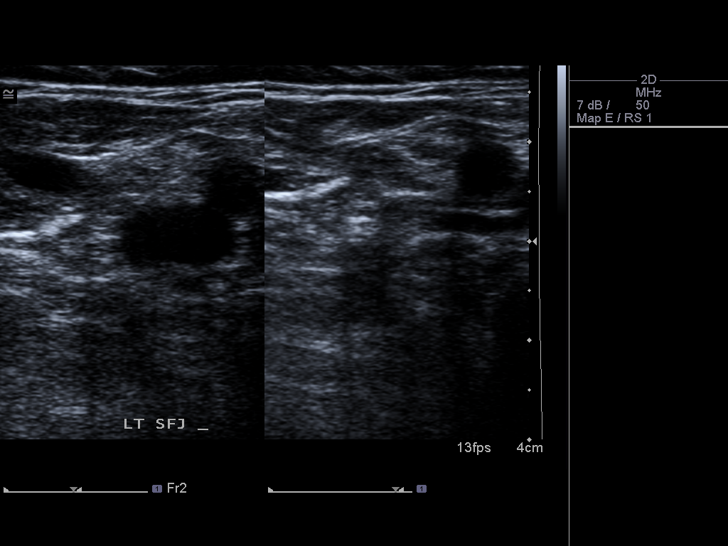
[im 4/22]
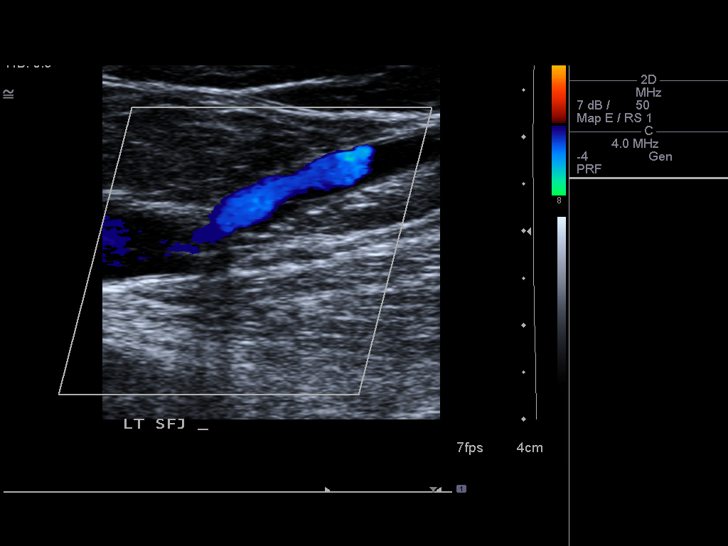
[im 6/22]
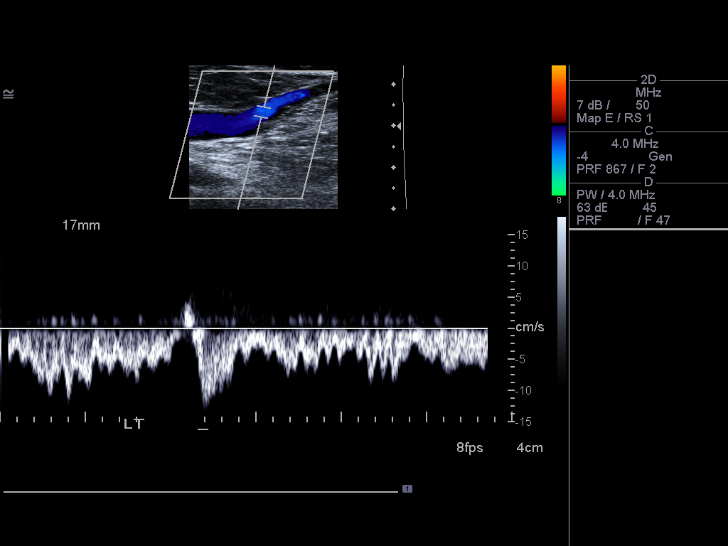
[im 8/22]
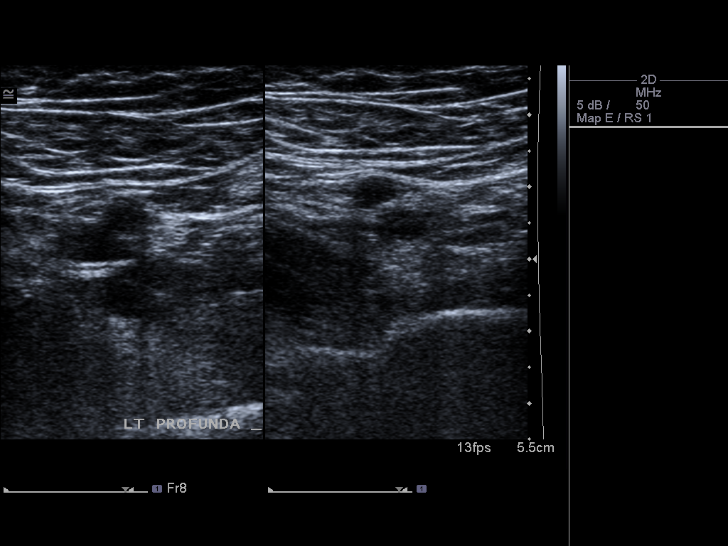
[im 9/22]
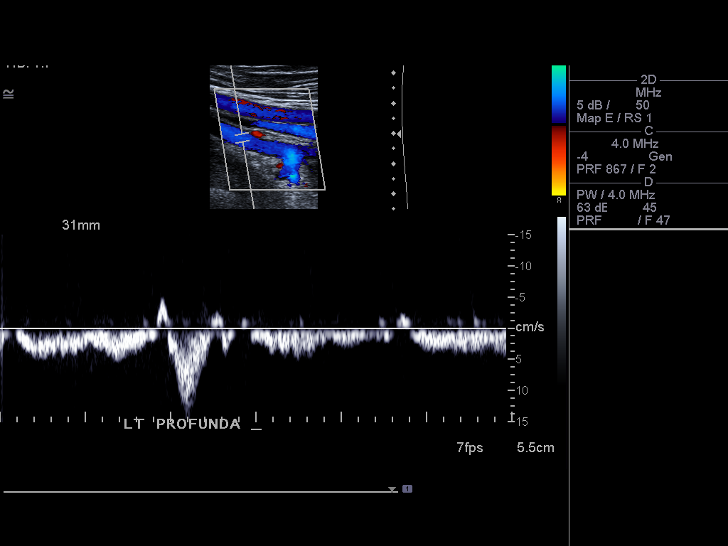
[im 11/22]
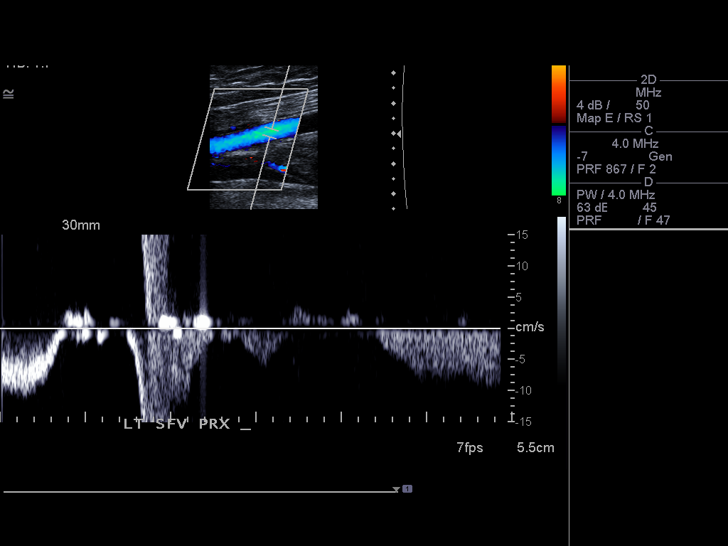
[im 12/22]
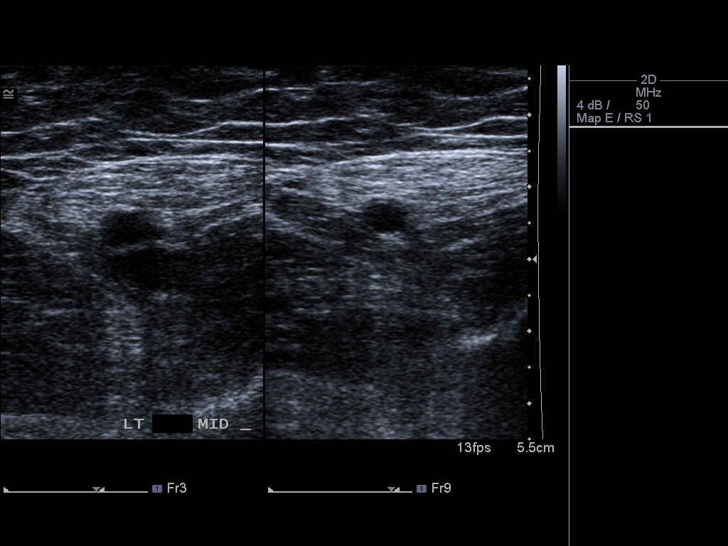
[im 14/22]
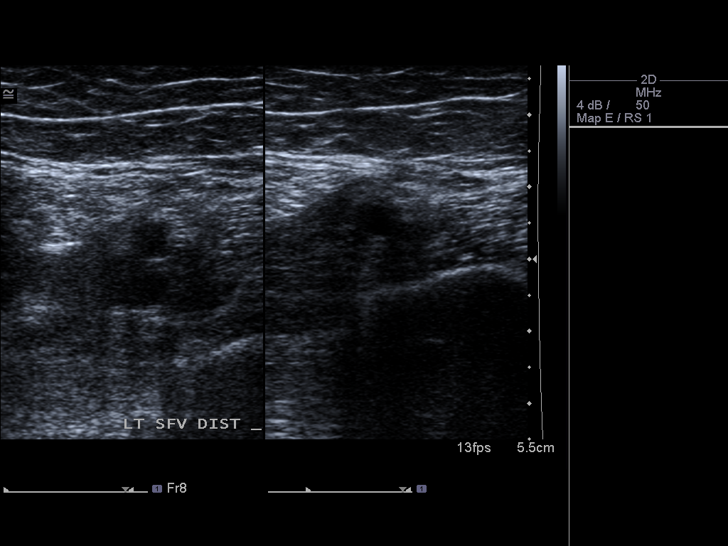
[im 15/22]
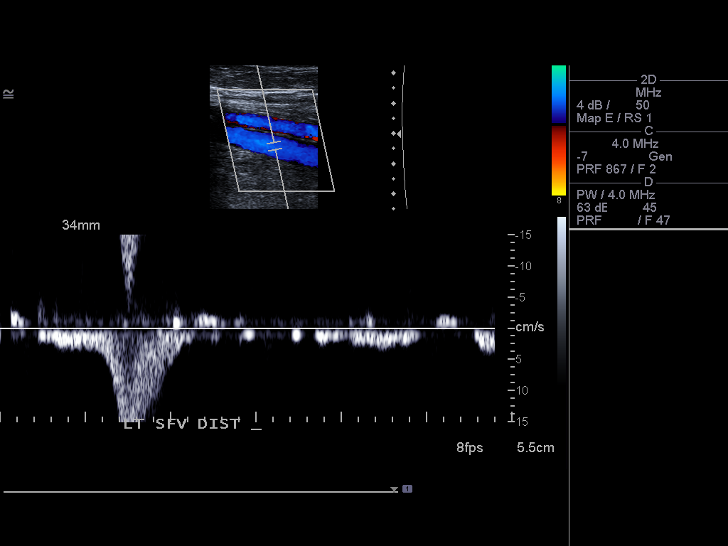
[im 17/22]
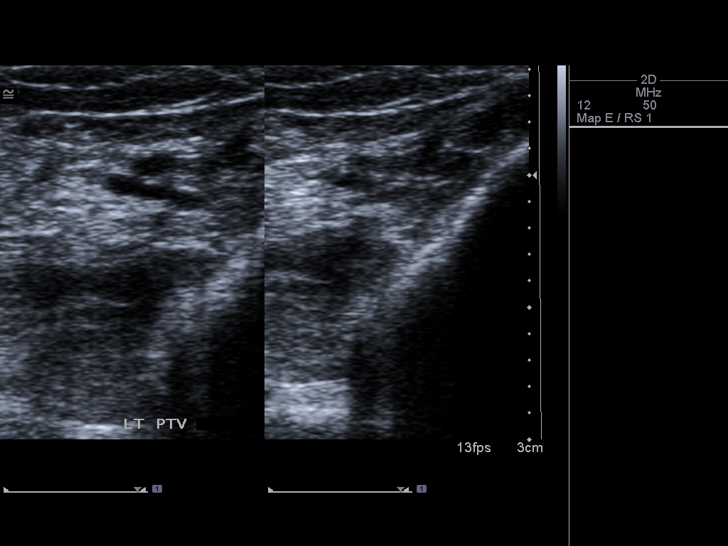
[im 19/22]
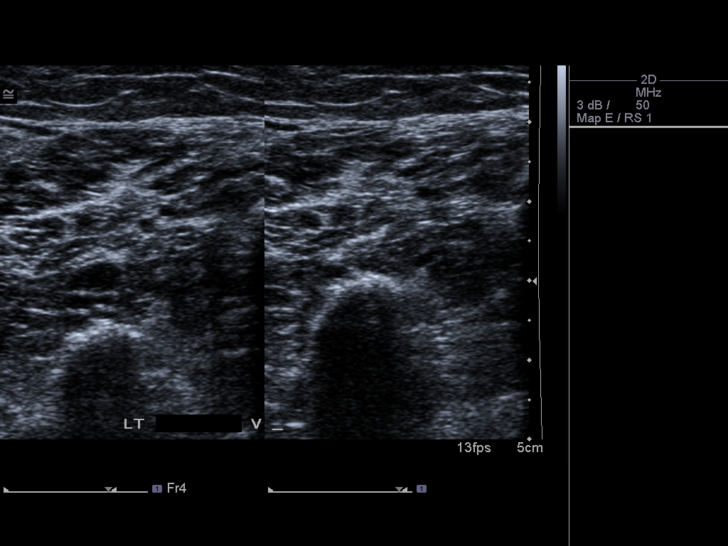
[im 20/22]
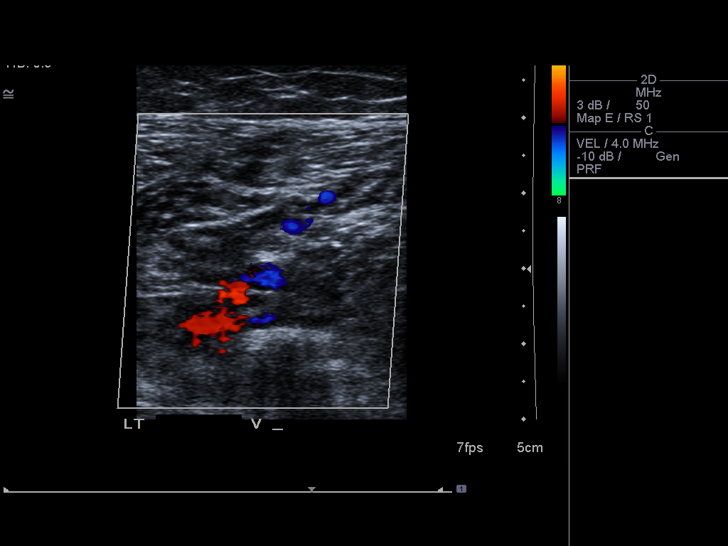
[im 22/22]
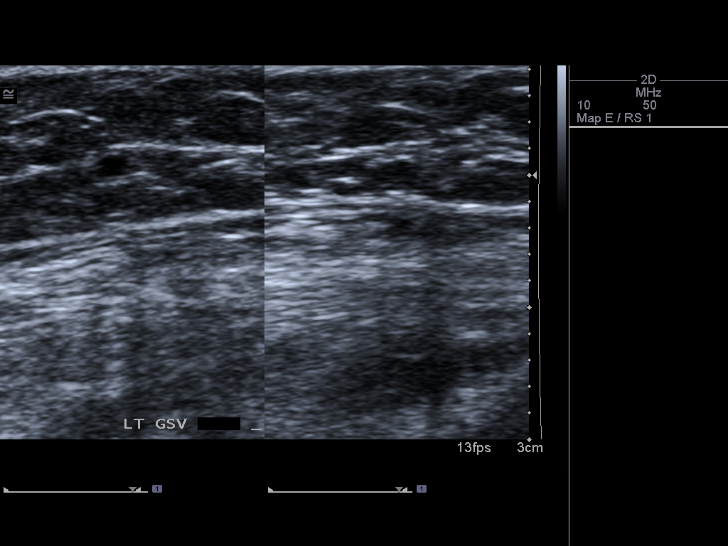

[14 of 22 positions shown; findings below may reference images not displayed]

FINDINGS: Deep venous system patent and compressible from left groin through
popliteal fossa.
Spontaneous venous flow present with intact augmentation and
evidence of respiratory phasicity.
No intraluminal thrombus identified.
Visualized portion of the greater saphenous system unremarkable.
Visualized portions of the deep venous system in the left calf also
appear patent.
IMPRESSION: No evidence of deep venous thrombosis in the left lower extremity.

## 2014-07-23 ENCOUNTER — Other Ambulatory Visit: Payer: Self-pay

## 2015-04-04 ENCOUNTER — Other Ambulatory Visit: Payer: Self-pay

## 2015-08-03 DIAGNOSIS — Z1231 Encounter for screening mammogram for malignant neoplasm of breast: Secondary | ICD-10-CM | POA: Diagnosis not present

## 2015-08-03 LAB — HM MAMMOGRAPHY: HM Mammogram: NEGATIVE

## 2015-08-10 ENCOUNTER — Encounter: Payer: Self-pay | Admitting: Gastroenterology

## 2015-08-10 ENCOUNTER — Encounter: Payer: Self-pay | Admitting: Family Medicine

## 2015-10-11 ENCOUNTER — Ambulatory Visit (AMBULATORY_SURGERY_CENTER): Payer: Self-pay

## 2015-10-11 VITALS — Ht 67.0 in | Wt 131.4 lb

## 2015-10-11 DIAGNOSIS — Z1211 Encounter for screening for malignant neoplasm of colon: Secondary | ICD-10-CM

## 2015-10-11 MED ORDER — SUPREP BOWEL PREP KIT 17.5-3.13-1.6 GM/177ML PO SOLN: 1.0000 | Freq: Once | ORAL | Status: DC

## 2015-10-11 NOTE — Progress Notes (Signed)
No allergies to eggs or soy No diet/weight loss meds No home oxygen No past problems with anesthesia  Has email and internet; refused emmi 

## 2015-10-24 ENCOUNTER — Ambulatory Visit (AMBULATORY_SURGERY_CENTER): Payer: Medicare HMO | Admitting: Gastroenterology

## 2015-10-24 ENCOUNTER — Encounter: Payer: Self-pay | Admitting: Gastroenterology

## 2015-10-24 VITALS — BP 125/83 | HR 63 | Temp 96.2°F | Resp 22 | Ht 67.0 in | Wt 131.0 lb

## 2015-10-24 DIAGNOSIS — Z1211 Encounter for screening for malignant neoplasm of colon: Secondary | ICD-10-CM

## 2015-10-24 MED ORDER — SODIUM CHLORIDE 0.9 % IV SOLN: 500.0000 mL | INTRAVENOUS | Status: DC

## 2015-10-24 NOTE — Op Note (Addendum)
[facility] [address] [place], [id number]   COLONOSCOPY PROCEDURE REPORT  PATIENT: Tonya Sheppard, Tonya Sheppard  MR#: [id number] BIRTHDATE: 1946-02-27 , 69  yrs. old GENDER: female ENDOSCOPIST: Marsa ArisKavitha Teofilo Lupinacci, MD REFERRED AO:[id number]:Yvonne Lowne, DO PROCEDURE DATE:  10/24/2015 PROCEDURE:   Colonoscopy, screening First Screening Colonoscopy - Avg.  risk and is 50 yrs.  old or older - No.  Prior Negative Screening - Now for repeat screening. 10 or more years since last screening  History of Adenoma - Now for follow-up colonoscopy & has been > or = to 3 yrs.  N/A  Polyps removed today? No Recommend repeat exam, <10 yrs? No ASA CLASS:   Class II INDICATIONS:Screening for colonic neoplasia and Colorectal Neoplasm Risk Assessment for this procedure is average risk. MEDICATIONS: Propofol 180 mg IV  DESCRIPTION OF PROCEDURE:   After the risks benefits and alternatives of the procedure were thoroughly explained, informed consent was obtained.  The digital rectal exam revealed no abnormalities of the rectum.   The LB [id number]-[id number]  endoscope was introduced through the anus and advanced to the cecum, which was identified by both the appendix and ileocecal valve. No adverse events experienced.   The quality of the prep was good.  The instrument was then slowly withdrawn as the colon was fully examined. Estimated blood loss is zero unless otherwise noted in this procedure report.    COLON FINDINGS: There was mild diverticulosis noted in the sigmoid colon and descending colon.   The examination was otherwise normal. Retroflexed views revealed no abnormalities. The time to cecum = 0.3 Withdrawal time = 6.4   The scope was withdrawn and the procedure completed. COMPLICATIONS: There were no immediate complications.  ENDOSCOPIC IMPRESSION: 1.   There was mild diverticulosis noted in the sigmoid colon and descending colon 2.   The examination was otherwise  normal  RECOMMENDATIONS: You should continue to follow colorectal cancer screening guidelines for "routine risk" patients with a repeat colonoscopy in 10 years. There is no need for FOBT (stool) testing for at least 5 years.  eSigned:  Marsa ArisKavitha Cedar Ditullio, MD 10/24/2015 10:06 AM Revised: 10/24/2015 10:06 AM

## 2015-10-24 NOTE — Patient Instructions (Signed)
Diverticulosis seen today. Handout given on diverticulosis.   YOU HAD AN ENDOSCOPIC PROCEDURE TODAY AT THE [facility]:   Refer to the procedure report that was given to you for any specific questions about what was found during the examination.  If the procedure report does not answer your questions, please call your gastroenterologist to clarify.  If you requested that your care partner not be given the details of your procedure findings, then the procedure report has been included in a sealed envelope for you to review at your convenience later.  YOU SHOULD EXPECT: Some feelings of bloating in the abdomen. Passage of more gas than usual.  Walking can help get rid of the air that was put into your GI tract during the procedure and reduce the bloating. If you had a lower endoscopy (such as a colonoscopy or flexible sigmoidoscopy) you may notice spotting of blood in your stool or on the toilet paper. If you underwent a bowel prep for your procedure, you may not have a normal bowel movement for a few days.  Please Note:  You might notice some irritation and congestion in your nose or some drainage.  This is from the oxygen used during your procedure.  There is no need for concern and it should clear up in a day or so.  SYMPTOMS TO REPORT IMMEDIATELY:   Following lower endoscopy (colonoscopy or flexible sigmoidoscopy):  Excessive amounts of blood in the stool  Significant tenderness or worsening of abdominal pains  Swelling of the abdomen that is new, acute  Fever of 100F or higher   For urgent or emergent issues, a gastroenterologist can be reached at any hour by calling [phone].   DIET: Your first meal following the procedure should be a small meal and then it is ok to progress to your normal diet. Heavy or fried foods are harder to digest and may make you feel nauseous or bloated.  Likewise, meals heavy in dairy and vegetables can increase bloating.  Drink plenty of fluids  but you should avoid alcoholic beverages for 24 hours.  ACTIVITY:  You should plan to take it easy for the rest of today and you should NOT DRIVE or use heavy machinery until tomorrow (because of the sedation medicines used during the test).    FOLLOW UP: Our staff will call the number listed on your records the next business day following your procedure to check on you and address any questions or concerns that you may have regarding the information given to you following your procedure. If we do not reach you, we will leave a message.  However, if you are feeling well and you are not experiencing any problems, there is no need to return our call.  We will assume that you have returned to your regular daily activities without incident.  If any biopsies were taken you will be contacted by phone or by letter within the next 1-3 weeks.  Please call us at [phone] if you have not heard about the biopsies in 3 weeks.    SIGNATURES/CONFIDENTIALITY: You and/or your care partner have signed paperwork which will be entered into your electronic medical record.  These signatures attest to the fact that that the information above on your After Visit Summary has been reviewed and is understood.  Full responsibility of the confidentiality of this discharge information lies with you and/or your care-partner.

## 2015-10-24 NOTE — Progress Notes (Signed)
To recovery, report to Myers, RN, VSS. 

## 2015-10-25 ENCOUNTER — Telehealth: Payer: Self-pay

## 2015-10-25 NOTE — Telephone Encounter (Signed)
  Follow up Call-  Call back number 10/24/2015  Post procedure Call Back phone  # [phone]  Permission to leave phone message Yes     Patient questions:  Do you have a fever, pain , or abdominal swelling? No. Pain Score  0 *  Have you tolerated food without any problems? Yes.    Have you been able to return to your normal activities? Yes.    Do you have any questions about your discharge instructions: Diet   No. Medications  No. Follow up visit  No.  Do you have questions or concerns about your Care? No.  Actions: * If pain score is 4 or above: No action needed, pain <4.

## 2016-03-01 ENCOUNTER — Encounter: Payer: Self-pay | Admitting: Family

## 2016-03-01 ENCOUNTER — Ambulatory Visit (INDEPENDENT_AMBULATORY_CARE_PROVIDER_SITE_OTHER): Payer: Medicare HMO | Admitting: Family

## 2016-03-01 ENCOUNTER — Other Ambulatory Visit: Payer: Medicare HMO

## 2016-03-01 VITALS — BP 148/92 | HR 78 | Temp 98.2°F | Resp 16 | Ht 67.0 in | Wt 133.4 lb

## 2016-03-01 DIAGNOSIS — R300 Dysuria: Secondary | ICD-10-CM

## 2016-03-01 LAB — POCT URINALYSIS DIPSTICK
Bilirubin, UA: NEGATIVE
GLUCOSE UA: NEGATIVE
Ketones, UA: NEGATIVE
NITRITE UA: NEGATIVE
PH UA: 6
PROTEIN UA: NEGATIVE
RBC UA: NEGATIVE
Spec Grav, UA: 1.02
UROBILINOGEN UA: NEGATIVE

## 2016-03-01 MED ORDER — SULFAMETHOXAZOLE-TRIMETHOPRIM 800-160 MG PO TABS: 1.0000 | ORAL_TABLET | Freq: Two times a day (BID) | ORAL | Status: DC

## 2016-03-01 MED ORDER — FLUCONAZOLE 150 MG PO TABS: 150.0000 mg | ORAL_TABLET | Freq: Once | ORAL | Status: DC

## 2016-03-01 NOTE — Assessment & Plan Note (Signed)
In office urinalysis positive for leukocytes and negative for nitrites and hematuria. Symptoms and exam consistent with cystitis. Start Bactrim. Follow-up if symptoms worsen or fail to improve.

## 2016-03-01 NOTE — Patient Instructions (Addendum)
Thank you for choosing [facility].  Summary/Instructions:  Your prescription(s) have been submitted to your pharmacy or been printed and provided for you. Please take as directed and contact our office if you believe you are having problem(s) with the medication(s) or have any questions.  If your symptoms worsen or fail to improve, please contact our office for further instruction, or in case of emergency go directly to the emergency room at the closest medical facility.   Urinary Tract Infection Urinary tract infections (UTIs) can develop anywhere along your urinary tract. Your urinary tract is your body's drainage system for removing wastes and extra water. Your urinary tract includes two kidneys, two ureters, a bladder, and a urethra. Your kidneys are a pair of bean-shaped organs. Each kidney is about the size of your fist. They are located below your ribs, one on each side of your spine. CAUSES Infections are caused by microbes, which are microscopic organisms, including fungi, viruses, and bacteria. These organisms are so small that they can only be seen through a microscope. Bacteria are the microbes that most commonly cause UTIs. SYMPTOMS  Symptoms of UTIs may vary by age and gender of the patient and by the location of the infection. Symptoms in young women typically include a frequent and intense urge to urinate and a painful, burning feeling in the bladder or urethra during urination. Older women and men are more likely to be tired, shaky, and weak and have muscle aches and abdominal pain. A fever may mean the infection is in your kidneys. Other symptoms of a kidney infection include pain in your back or sides below the ribs, nausea, and vomiting. DIAGNOSIS To diagnose a UTI, your caregiver will ask you about your symptoms. Your caregiver will also ask you to provide a urine sample. The urine sample will be tested for bacteria and white blood cells. White blood cells are made by your  body to help fight infection. TREATMENT  Typically, UTIs can be treated with medication. Because most UTIs are caused by a bacterial infection, they usually can be treated with the use of antibiotics. The choice of antibiotic and length of treatment depend on your symptoms and the type of bacteria causing your infection. HOME CARE INSTRUCTIONS  If you were prescribed antibiotics, take them exactly as your caregiver instructs you. Finish the medication even if you feel better after you have only taken some of the medication.  Drink enough water and fluids to keep your urine clear or pale yellow.  Avoid caffeine, tea, and carbonated beverages. They tend to irritate your bladder.  Empty your bladder often. Avoid holding urine for long periods of time.  Empty your bladder before and after sexual intercourse.  After a bowel movement, women should cleanse from front to back. Use each tissue only once. SEEK MEDICAL CARE IF:   You have back pain.  You develop a fever.  Your symptoms do not begin to resolve within 3 days. SEEK IMMEDIATE MEDICAL CARE IF:   You have severe back pain or lower abdominal pain.  You develop chills.  You have nausea or vomiting.  You have continued burning or discomfort with urination. MAKE SURE YOU:   Understand these instructions.  Will watch your condition.  Will get help right away if you are not doing well or get worse.   This information is not intended to replace advice given to you by your health care provider. Make sure you discuss any questions you have with your health care   provider.   Document Released: 07/04/2005 Document Revised: 06/15/2015 Document Reviewed: 11/02/2011 Elsevier Interactive Patient Education 2016 Elsevier Inc.  

## 2016-03-01 NOTE — Progress Notes (Signed)
Pre visit review using our clinic review tool, if applicable. No additional management support is needed unless otherwise documented below in the visit note. 

## 2016-03-01 NOTE — Progress Notes (Signed)
Subjective:    Patient ID: Tonya Sheppard, female    DOB: 16-Apr-1946, 70 y.o.   MRN: [id number]  Chief Complaint  Patient presents with  . Dysuria    urinary urgency, frequency, and dysuria, x2 days    HPI:  Tonya Sheppard is a 70 y.o. female who  has a past medical history of Osteopenia. and presents today For an acute office visit.  This is a new problem. Associated symptoms of urinary urgency, frequency, and dysuria been going on for approximately 2 days. Denies fevers. Denies any modifying factors or treatments attempted.    No Known Allergies   Current Outpatient Prescriptions on File Prior to Visit  Medication Sig Dispense Refill  . alendronate (FOSAMAX) 70 MG tablet     . Calcium Carb-Cholecalciferol (CALCIUM 1000 + D PO) Take 1,000 mg by mouth 2 (two) times daily.    . meloxicam (MOBIC) 15 MG tablet Take 1 tablet (15 mg total) by mouth daily. 30 tablet 3   No current facility-administered medications on file prior to visit.    Review of Systems  Constitutional: Negative for fever and chills.  Genitourinary: Positive for dysuria, urgency and frequency. Negative for flank pain.      Objective:    BP 148/92 mmHg  Pulse 78  Temp(Src) 98.2 F (36.8 C) (Oral)  Resp 16  Ht  (1.702 m)  Wt 133 lb 6.4 oz (60.51 kg)  BMI 20.89 kg/m2  SpO2 96% Nursing note and vital signs reviewed.  Physical Exam  Constitutional: She is oriented to person, place, and time. She appears well-developed and well-nourished. No distress.  Cardiovascular: Normal rate, regular rhythm, normal heart sounds and intact distal pulses.   Pulmonary/Chest: Effort normal and breath sounds normal.  Abdominal: There is no CVA tenderness.  Neurological: She is alert and oriented to person, place, and time.  Skin: Skin is warm and dry.  Psychiatric: She has a normal mood and affect. Her behavior is normal. Judgment and thought content normal.       Assessment & Plan:   Problem List Items  Addressed This Visit      Other   Dysuria - Primary    In office urinalysis positive for leukocytes and negative for nitrites and hematuria. Symptoms and exam consistent with cystitis. Start Bactrim. Follow-up if symptoms worsen or fail to improve.      Relevant Medications   sulfamethoxazole-trimethoprim (BACTRIM DS,SEPTRA DS) 800-160 MG tablet   Other Relevant Orders   POCT urinalysis dipstick (Completed)   Urine culture       I am having Ms. Bogert start on sulfamethoxazole-trimethoprim and fluconazole. I am also having her maintain her Calcium Carb-Cholecalciferol (CALCIUM 1000 + D PO), meloxicam, and alendronate.   Meds ordered this encounter  Medications  . sulfamethoxazole-trimethoprim (BACTRIM DS,SEPTRA DS) 800-160 MG tablet    Sig: Take 1 tablet by mouth 2 (two) times daily.    Dispense:  10 tablet    Refill:  0    Order Specific Question:  Supervising Provider    Answer:  Hillard Danker A [[id number]]  . fluconazole (DIFLUCAN) 150 MG tablet    Sig: Take 1 tablet (150 mg total) by mouth once. May repeat once if needed in 48 hours.    Dispense:  2 tablet    Refill:  0    Order Specific Question:  Supervising Provider    Answer:  Hillard Danker A [[id number]]     Follow-up: Return if symptoms worsen or  fail to improve.  Jeanine Luzalone, [place] Labo, FNP

## 2016-03-04 LAB — URINE CULTURE: Colony Count: 95000

## 2016-03-06 ENCOUNTER — Telehealth: Payer: Self-pay | Admitting: Family

## 2016-03-06 ENCOUNTER — Encounter: Payer: Self-pay | Admitting: Family

## 2016-03-18 NOTE — Telephone Encounter (Signed)
Error

## 2016-04-12 DIAGNOSIS — R69 Illness, unspecified: Secondary | ICD-10-CM | POA: Diagnosis not present

## 2016-04-17 DIAGNOSIS — H524 Presbyopia: Secondary | ICD-10-CM | POA: Diagnosis not present

## 2016-04-17 DIAGNOSIS — H17822 Peripheral opacity of cornea, left eye: Secondary | ICD-10-CM | POA: Diagnosis not present

## 2016-04-17 DIAGNOSIS — H25043 Posterior subcapsular polar age-related cataract, bilateral: Secondary | ICD-10-CM | POA: Diagnosis not present

## 2016-04-17 DIAGNOSIS — H2513 Age-related nuclear cataract, bilateral: Secondary | ICD-10-CM | POA: Diagnosis not present

## 2016-04-17 DIAGNOSIS — H5202 Hypermetropia, left eye: Secondary | ICD-10-CM | POA: Diagnosis not present

## 2016-04-17 DIAGNOSIS — H5211 Myopia, right eye: Secondary | ICD-10-CM | POA: Diagnosis not present

## 2016-04-17 DIAGNOSIS — H43812 Vitreous degeneration, left eye: Secondary | ICD-10-CM | POA: Diagnosis not present

## 2016-04-17 DIAGNOSIS — H52221 Regular astigmatism, right eye: Secondary | ICD-10-CM | POA: Diagnosis not present

## 2016-05-21 ENCOUNTER — Encounter: Payer: Medicare HMO | Admitting: Family Medicine

## 2016-05-29 DIAGNOSIS — Z6821 Body mass index (BMI) 21.0-21.9, adult: Secondary | ICD-10-CM | POA: Diagnosis not present

## 2016-05-29 DIAGNOSIS — M8588 Other specified disorders of bone density and structure, other site: Secondary | ICD-10-CM | POA: Diagnosis not present

## 2016-05-29 DIAGNOSIS — N958 Other specified menopausal and perimenopausal disorders: Secondary | ICD-10-CM | POA: Diagnosis not present

## 2016-05-29 DIAGNOSIS — Z01419 Encounter for gynecological examination (general) (routine) without abnormal findings: Secondary | ICD-10-CM | POA: Diagnosis not present

## 2016-07-26 ENCOUNTER — Encounter: Payer: Medicare HMO | Admitting: Family Medicine

## 2016-08-06 DIAGNOSIS — M545 Low back pain: Secondary | ICD-10-CM | POA: Diagnosis not present

## 2016-08-06 DIAGNOSIS — M4696 Unspecified inflammatory spondylopathy, lumbar region: Secondary | ICD-10-CM | POA: Diagnosis not present

## 2016-08-09 DIAGNOSIS — Z1231 Encounter for screening mammogram for malignant neoplasm of breast: Secondary | ICD-10-CM | POA: Diagnosis not present

## 2016-08-09 DIAGNOSIS — H17822 Peripheral opacity of cornea, left eye: Secondary | ICD-10-CM | POA: Diagnosis not present

## 2016-08-09 DIAGNOSIS — H25043 Posterior subcapsular polar age-related cataract, bilateral: Secondary | ICD-10-CM | POA: Diagnosis not present

## 2016-08-09 DIAGNOSIS — H524 Presbyopia: Secondary | ICD-10-CM | POA: Diagnosis not present

## 2016-08-09 DIAGNOSIS — H2513 Age-related nuclear cataract, bilateral: Secondary | ICD-10-CM | POA: Diagnosis not present

## 2016-08-09 DIAGNOSIS — H43812 Vitreous degeneration, left eye: Secondary | ICD-10-CM | POA: Diagnosis not present

## 2016-09-03 DIAGNOSIS — H2511 Age-related nuclear cataract, right eye: Secondary | ICD-10-CM | POA: Diagnosis not present

## 2016-09-10 DIAGNOSIS — H2511 Age-related nuclear cataract, right eye: Secondary | ICD-10-CM | POA: Diagnosis not present

## 2016-09-10 DIAGNOSIS — H25811 Combined forms of age-related cataract, right eye: Secondary | ICD-10-CM | POA: Diagnosis not present

## 2016-09-17 ENCOUNTER — Encounter: Payer: Medicare HMO | Admitting: Family Medicine

## 2016-09-30 DIAGNOSIS — H2512 Age-related nuclear cataract, left eye: Secondary | ICD-10-CM | POA: Diagnosis not present

## 2016-10-10 NOTE — Progress Notes (Signed)
Subjective:   Tonya Sheppard is a 71 y.o. female who presents for Medicare Annual (Subsequent) preventive examination.  Review of Systems:  No ROS.  Medicare Wellness Visit.  Cardiac Risk Factors include: advanced age (>2655men, 60>65 women)  Sleep patterns: no sleep issues, feels rested on waking, does not get up to void and 7-8 hours nightly.   Home Safety/Smoke Alarms: Feels safe in home. Smoke alarms in place.  Living environment; residence and Firearm Safety: Lives w/ husband in [place] house/ trailer, number of outside stairs: 0, no firearms. Seat Belt Safety/Bike Helmet: Wears seat belt.  Female:   Pap- Follows w/ GYN.       Mammo- last 08/03/15. BI-RADS Category 1: Negative.        Dexa scan- Last 06/08/16, pt reported. Osteopenia. Follows w/ GYN.       CCS- last 10/24/15 w/ Dr. Marsa ArisKavitha Nandigam. Mild diverticulosis, otherwise normal. 10 year recall.     Objective:     Vitals: BP 139/85   Pulse 78   Temp 98.1 F (36.7 C) (Oral)   Resp 16   Ht 5' 7.4" (1.712 m)   Wt 131 lb 9.6 oz (59.7 kg)   SpO2 99%   BMI 20.37 kg/m   Body mass index is 20.37 kg/m.   Tobacco  History  Smoking Status  . Never Smoker  Smokeless Tobacco  . Never Used     Counseling given: Not Answered   Past Medical History:  Diagnosis Date  . Osteopenia    Past Surgical History:  Procedure Laterality Date  . CATARACT EXTRACTION    . EYE SURGERY     cateract right eye  . TUBAL LIGATION     Family History  Problem Relation Age of Onset  . Hypertension Mother   . Heart disease Mother   . Heart disease Father [id number]    MI  . Cancer Sister 6150    ovarian  . Hepatitis Brother   . Alcohol abuse Brother   . Alcohol abuse Sister   . Colon cancer Neg Hx    History  Sexual Activity  . Sexual activity: Yes  . Partners: Male    Outpatient Encounter Prescriptions as of 10/11/2016  Medication Sig  . alendronate (FOSAMAX) 70 MG tablet   . meloxicam (MOBIC) 15 MG tablet Take 1 tablet (15 mg  total) by mouth daily.  . [DISCONTINUED] Calcium Carb-Cholecalciferol (CALCIUM 1000 + D PO) Take 1,000 mg by mouth 2 (two) times daily.  . [DISCONTINUED] fluconazole (DIFLUCAN) 150 MG tablet Take 1 tablet (150 mg total) by mouth once. May repeat once if needed in 48 hours.  . [DISCONTINUED] sulfamethoxazole-trimethoprim (BACTRIM DS,SEPTRA DS) 800-160 MG tablet Take 1 tablet by mouth 2 (two) times daily.   No facility-administered encounter medications on file as of 10/11/2016.     Activities of Daily Living In your present state of health, do you have any difficulty performing the following activities: 10/11/2016  Hearing? Y  Vision? N  Difficulty concentrating or making decisions? N  Walking or climbing stairs? N  Dressing or bathing? N  Doing errands, shopping? N  Preparing Food and eating ? N  Using the Toilet? N  In the past six months, have you accidently leaked urine? N  Do you have problems with loss of bowel control? N  Managing your Medications? N  Managing your Finances? N  Housekeeping or managing your Housekeeping? N  Some recent data might be hidden    Patient Care Team:  Donato Schultz, DO as PCP - General Doristine Section, MD as Consulting Physician (Orthopedic Surgery) Richarda Overlie, MD as Consulting Physician (Obstetrics and Gynecology) Nelson Chimes, MD as Consulting Physician (Ophthalmology)    Assessment:    Physical assessment deferred to PCP.  Exercise Activities and Dietary recommendations Current Exercise Habits: Structured exercise class ([employer]), Type of exercise: stretching;strength training/weights;treadmill, Time (Minutes): 60, Frequency (Times/Week): 3, Weekly Exercise (Minutes/Week): 180, Intensity: Moderate  Diet (meal preparation, eat out, water intake, caffeinated beverages, dairy products, fruits and vegetables): in general, a "healthy" diet  , well balanced, on average, 1-2 meals per day. Salad, meat, vegetable. Drinks water throughout the day. 2  cups coffee daily. Snacks throughout the day vs eating formal meals.  Goals    . Healthy Lifestyle          Continue to eat heart healthy diet (full of fruits, vegetables, whole grains, lean protein, water--limit salt, fat, and sugar intake) and increase physical activity as tolerated. Continue doing brain stimulating activities (puzzles, reading, adult coloring books, staying active) to keep memory sharp.       Fall Risk Fall Risk  10/11/2016 12/24/2013  Falls in the past year? No No   Depression Screen PHQ 2/9 Scores 10/11/2016 12/24/2013  PHQ - 2 Score 0 0     Cognitive Function MMSE - Mini Mental State Exam 10/11/2016  Orientation to time 5  Orientation to Place 5  Registration 3  Attention/ Calculation 5  Recall 3  Language- name 2 objects 2  Language- repeat 1  Language- follow 3 step command 3  Language- read & follow direction 1  Write a sentence 1  Copy design 1  Total score 30        Immunization History  Administered Date(s) Administered  . Influenza Whole 08/16/2010, 07/27/2011  . Influenza-Unspecified 06/08/2013  . Pneumococcal Polysaccharide-23 12/24/2013  . Tdap 08/28/2011  . Zoster 06/20/2010   Screening Tests Health Maintenance  Topic Date Due  . PNA vac Low Risk Adult (2 of 2 - PCV13) 12/25/2014  . INFLUENZA VACCINE  05/08/2016  . Hepatitis C Screening  10/11/2021 (Originally June 07, 1946)  . MAMMOGRAM  08/02/2017  . TETANUS/TDAP  08/27/2021  . COLONOSCOPY  10/23/2025  . DEXA SCAN  Addressed  . ZOSTAVAX  Completed      Plan:    Follow-up w/ PCP as scheduled.  During the course of the visit the patient was educated and counseled about the following appropriate screening and preventive services:   Vaccines to include Pneumoccal, Influenza, Hepatitis B, Td, Zostavax, HCV  Electrocardiogram  Cardiovascular Disease  Colorectal cancer screening  Bone density screening  Diabetes screening  Glaucoma screening  Mammography/PAP  Nutrition  counseling   Patient Instructions (the written plan) was given to the patient.   Starla Link, RN  10/11/2016

## 2016-10-10 NOTE — Progress Notes (Signed)
Pre visit review using our clinic review tool, if applicable. No additional management support is needed unless otherwise documented below in the visit note. 

## 2016-10-11 ENCOUNTER — Ambulatory Visit (INDEPENDENT_AMBULATORY_CARE_PROVIDER_SITE_OTHER): Payer: Medicare HMO | Admitting: Family Medicine

## 2016-10-11 ENCOUNTER — Encounter: Payer: Self-pay | Admitting: Family Medicine

## 2016-10-11 VITALS — BP 139/85 | HR 78 | Temp 98.1°F | Resp 16 | Ht 67.4 in | Wt 131.6 lb

## 2016-10-11 DIAGNOSIS — Z0000 Encounter for general adult medical examination without abnormal findings: Secondary | ICD-10-CM | POA: Diagnosis not present

## 2016-10-11 DIAGNOSIS — Z23 Encounter for immunization: Secondary | ICD-10-CM | POA: Diagnosis not present

## 2016-10-11 DIAGNOSIS — E785 Hyperlipidemia, unspecified: Secondary | ICD-10-CM

## 2016-10-11 LAB — POCT URINALYSIS DIPSTICK
Bilirubin, UA: NEGATIVE
Blood, UA: NEGATIVE
Glucose, UA: NEGATIVE
Ketones, UA: NEGATIVE
Leukocytes, UA: NEGATIVE
Nitrite, UA: NEGATIVE
Protein, UA: NEGATIVE
Spec Grav, UA: 1.02
Urobilinogen, UA: NEGATIVE
pH, UA: 6

## 2016-10-11 LAB — COMPREHENSIVE METABOLIC PANEL WITH GFR
ALT: 13 U/L (ref 0–35)
AST: 16 U/L (ref 0–37)
Albumin: 4.4 g/dL (ref 3.5–5.2)
Alkaline Phosphatase: 38 U/L — ABNORMAL LOW (ref 39–117)
BUN: 16 mg/dL (ref 6–23)
CO2: 29 meq/L (ref 19–32)
Calcium: 9.6 mg/dL (ref 8.4–10.5)
Chloride: 105 meq/L (ref 96–112)
Creatinine, Ser: 0.71 mg/dL (ref 0.40–1.20)
GFR: 86.49 mL/min
Glucose, Bld: 100 mg/dL — ABNORMAL HIGH (ref 70–99)
Potassium: 3.7 meq/L (ref 3.5–5.1)
Sodium: 140 meq/L (ref 135–145)
Total Bilirubin: 0.5 mg/dL (ref 0.2–1.2)
Total Protein: 7.5 g/dL (ref 6.0–8.3)

## 2016-10-11 LAB — LIPID PANEL
Cholesterol: 276 mg/dL — ABNORMAL HIGH (ref 0–200)
HDL: 82.8 mg/dL
LDL Cholesterol: 181 mg/dL — ABNORMAL HIGH (ref 0–99)
NonHDL: 193.46
Total CHOL/HDL Ratio: 3
Triglycerides: 63 mg/dL (ref 0.0–149.0)
VLDL: 12.6 mg/dL (ref 0.0–40.0)

## 2016-10-11 LAB — CBC WITH DIFFERENTIAL/PLATELET
Basophils Absolute: 0 10*3/uL (ref 0.0–0.1)
Basophils Relative: 0.6 % (ref 0.0–3.0)
Eosinophils Absolute: 0.2 10*3/uL (ref 0.0–0.7)
Eosinophils Relative: 2.3 % (ref 0.0–5.0)
HCT: 39.8 % (ref 36.0–46.0)
Hemoglobin: 13.3 g/dL (ref 12.0–15.0)
Lymphocytes Relative: 34.1 % (ref 12.0–46.0)
Lymphs Abs: 2.6 10*3/uL (ref 0.7–4.0)
MCHC: 33.5 g/dL (ref 30.0–36.0)
MCV: 90.7 fl (ref 78.0–100.0)
Monocytes Absolute: 0.6 10*3/uL (ref 0.1–1.0)
Monocytes Relative: 7.4 % (ref 3.0–12.0)
Neutro Abs: 4.2 10*3/uL (ref 1.4–7.7)
Neutrophils Relative %: 55.6 % (ref 43.0–77.0)
Platelets: 286 10*3/uL (ref 150.0–400.0)
RBC: 4.38 Mil/uL (ref 3.87–5.11)
RDW: 13.6 % (ref 11.5–15.5)
WBC: 7.5 10*3/uL (ref 4.0–10.5)

## 2016-10-11 NOTE — Progress Notes (Signed)
Pre visit review using our clinic review tool, if applicable. No additional management support is needed unless otherwise documented below in the visit note. 

## 2016-10-11 NOTE — Progress Notes (Signed)
Tonya MountLinda R Sheppard is a 71 y.o. female and is here for a comprehensive physical exam. The patient reports no problems.  Social History   Social History  . Marital status: Married    Spouse name: N/A  . Number of children: N/A  . Years of education: N/A   Occupational History  . financial analyst--retired [employer]   Social History Main Topics  . Smoking status: Never Smoker  . Smokeless tobacco: Never Used  . Alcohol use 4.2 - 8.4 oz/week    7 - 14 Glasses of wine per week  . Drug use: No  . Sexual activity: Yes    Partners: Male   Other Topics Concern  . Not on file   Social History Narrative   Exercise- [employer], yard work   Health Maintenance  Topic Date Due  . Hepatitis C Screening  09-12-1946  . PNA vac Low Risk Adult (2 of 2 - PCV13) 12/25/2014  . INFLUENZA VACCINE  05/08/2016  . MAMMOGRAM  08/02/2017  . TETANUS/TDAP  08/27/2021  . COLONOSCOPY  10/23/2025  . DEXA SCAN  Addressed  . ZOSTAVAX  Completed    The following portions of the patient's history were reviewed and updated as appropriate:  She  has a past medical history of Osteopenia. She  does not have any pertinent problems on file. She  has a past surgical history that includes Tubal ligation and Eye surgery. Her family history includes Alcohol abuse in her brother and sister; Cancer (age of onset: 750) in her sister; Heart disease in her mother; Heart disease (age of onset: 6379) in her father; Hepatitis in her brother; Hypertension in her mother. She  reports that she has never smoked. She has never used smokeless tobacco. She reports that she drinks about 4.2 - 8.4 oz of alcohol per week . She reports that she does not use drugs. She has a current medication list which includes the following prescription(s): alendronate and meloxicam. Current Outpatient Prescriptions on File Prior to Visit  Medication Sig Dispense Refill  . alendronate (FOSAMAX) 70 MG tablet     . meloxicam (MOBIC) 15  MG tablet Take 1 tablet (15 mg total) by mouth daily. 30 tablet 3   No current facility-administered medications on file prior to visit.    She has No Known Allergies..  Review of Systems Review of Systems  Constitutional: Negative for activity change, appetite change and fatigue.  HENT: Negative for hearing loss, congestion, tinnitus and ear discharge.  dentist [id number] Eyes: Negative for visual disturbance (see optho q1y -- vision corrected to 20/20 with glasses).  Respiratory: Negative for cough, chest tightness and shortness of breath.   Cardiovascular: Negative for chest pain, palpitations and leg swelling.  Gastrointestinal: Negative for abdominal pain, diarrhea, constipation and abdominal distention.  Genitourinary: Negative for urgency, frequency, decreased urine volume and difficulty urinating.  Musculoskeletal: Negative for back pain, arthralgias and gait problem.  Skin: Negative for color change, pallor and rash.  Neurological: Negative for dizziness, light-headedness, numbness and headaches.  Hematological: Negative for adenopathy. Does not bruise/bleed easily.  Psychiatric/Behavioral: Negative for suicidal ideas, confusion, sleep disturbance, self-injury, dysphoric mood, decreased concentration and agitation.       Objective:    BP 139/85   Pulse 78   Temp 98.1 F (36.7 C) (Oral)   Resp 16   Ht 5' 7.4" (1.712 m)   Wt 131 lb 9.6 oz (59.7 kg)   SpO2 99%   BMI  20.37 kg/m  General appearance: alert, cooperative, appears stated age and no distress Head: Normocephalic, without obvious abnormality, atraumatic Eyes: conjunctivae/corneas clear. PERRL, EOM's intact. Fundi benign. Ears: normal TM's and external ear canals both ears Nose: Nares normal. Septum midline. Mucosa normal. No drainage or sinus tenderness. Throat: lips, mucosa, and tongue normal; teeth and gums normal Neck: no adenopathy, no carotid bruit, no JVD, supple, symmetrical, trachea midline and thyroid not  enlarged, symmetric, no tenderness/mass/nodules Back: symmetric, no curvature. ROM normal. No CVA tenderness. Lungs: clear to auscultation bilaterally Breasts: normal appearance, no masses or tenderness Heart: regular rate and rhythm, S1, S2 normal, no murmur, click, rub or gallop Abdomen: soft, non-tender; bowel sounds normal; no masses,  no organomegaly Pelvic: deferred Extremities: extremities normal, atraumatic, no cyanosis or edema Pulses: 2+ and symmetric Skin: Skin color, texture, turgor normal. No rashes or lesions Lymph nodes: Cervical, supraclavicular, and axillary nodes normal. Neurologic: Alert and oriented X 3, normal strength and tone. Normal symmetric reflexes. Normal coordination and gait    Assessment:    Healthy female exam.      Plan:    ghm utd Check labs See After Visit Summary for Counseling Recommendations    1. Preventative health care See above  2. Encounter for Medicare annual wellness exam To be done by RN today  3. Hyperlipidemia, unspecified hyperlipidemia type Check labs - Comprehensive metabolic panel - CBC with Differential/Platelet - Lipid panel - POCT urinalysis dipstick - EKG 12-Lead  4. Need for pneumococcal vaccine   - Pneumococcal conjugate vaccine 13-valent  5. Influenza vaccine needed   - Flu vaccine HIGH DOSE PF (Fluzone High Dose)

## 2016-10-11 NOTE — Progress Notes (Signed)
reviewed

## 2016-10-11 NOTE — Patient Instructions (Addendum)
Bring a copy of your advance directives to your next office visit.  Preventive Care 71 Years and Older, Female Preventive care refers to lifestyle choices and visits with your health care provider that can promote health and wellness. What does preventive care include?  A yearly physical exam. This is also called an annual well check.  Dental exams once or twice a year.  Routine eye exams. Ask your health care provider how often you should have your eyes checked.  Personal lifestyle choices, including: ? Daily care of your teeth and gums. ? Regular physical activity. ? Eating a healthy diet. ? Avoiding tobacco and drug use. ? Limiting alcohol use. ? Practicing safe sex. ? Taking low-dose aspirin every day. ? Taking vitamin and mineral supplements as recommended by your health care provider. What happens during an annual well check? The services and screenings done by your health care provider during your annual well check will depend on your age, overall health, lifestyle risk factors, and family history of disease. Counseling Your health care provider may ask you questions about your:  Alcohol use.  Tobacco use.  Drug use.  Emotional well-being.  Home and relationship well-being.  Sexual activity.  Eating habits.  History of falls.  Memory and ability to understand (cognition).  Work and work environment.  Reproductive health.  Screening You may have the following tests or measurements:  Height, weight, and BMI.  Blood pressure.  Lipid and cholesterol levels. These may be checked every 5 years, or more frequently if you are over 50 years old.  Skin check.  Lung cancer screening. You may have this screening every year starting at age 71 if you have a 30-pack-year history of smoking and currently smoke or have quit within the past 15 years.  Fecal occult blood test (FOBT) of the stool. You may have this test every year starting at age 71.  Flexible  sigmoidoscopy or colonoscopy. You may have a sigmoidoscopy every 5 years or a colonoscopy every 10 years starting at age 71.  Hepatitis C blood test.  Hepatitis B blood test.  Sexually transmitted disease (STD) testing.  Diabetes screening. This is done by checking your blood sugar (glucose) after you have not eaten for a while (fasting). You may have this done every 1-3 years.  Bone density scan. This is done to screen for osteoporosis. You may have this done starting at age 71.  Mammogram. This may be done every 1-2 years. Talk to your health care provider about how often you should have regular mammograms.  Talk with your health care provider about your test results, treatment options, and if necessary, the need for more tests. Vaccines Your health care provider may recommend certain vaccines, such as:  Influenza vaccine. This is recommended every year.  Tetanus, diphtheria, and acellular pertussis (Tdap, Td) vaccine. You may need a Td booster every 10 years.  Varicella vaccine. You may need this if you have not been vaccinated.  Zoster vaccine. You may need this after age 71.  Measles, mumps, and rubella (MMR) vaccine. You may need at least one dose of MMR if you were born in 1957 or later. You may also need a second dose.  Pneumococcal 13-valent conjugate (PCV13) vaccine. One dose is recommended after age 71.  Pneumococcal polysaccharide (PPSV23) vaccine. One dose is recommended after age 71.  Meningococcal vaccine. You may need this if you have certain conditions.  Hepatitis A vaccine. You may need this if you have certain conditions or if   if you travel or work in places where you may be exposed to hepatitis A.  Hepatitis B vaccine. You may need this if you have certain conditions or if you travel or work in places where you may be exposed to hepatitis B.  Haemophilus influenzae type b (Hib) vaccine. You may need this if you have certain conditions. Talk to your health care  provider about which screenings and vaccines you need and how often you need them. This information is not intended to replace advice given to you by your health care provider. Make sure you discuss any questions you have with your health care provider. Document Released: 10/21/2015 Document Revised: 06/13/2016 Document Reviewed: 07/26/2015 Elsevier Interactive Patient Education  2017 [employer].   Influenza (Flu) Vaccine (Inactivated or Recombinant): What You Need to Know 1. Why get vaccinated? Influenza ("flu") is a contagious disease that spreads around the [place] every year, usually between October and May. Flu is caused by influenza viruses, and is spread mainly by coughing, sneezing, and close contact. Anyone can get flu. Flu strikes suddenly and can last several days. Symptoms vary by age, but can include:  fever/chills  sore throat  muscle aches  fatigue  cough  headache  runny or stuffy nose Flu can also lead to pneumonia and blood infections, and cause diarrhea and seizures in children. If you have a medical condition, such as heart or lung disease, flu can make it worse. Flu is more dangerous for some people. Infants and young children, people 68 years of age and older, pregnant women, and people with certain health conditions or a weakened immune system are at greatest risk. Each year thousands of people in the [place] die from flu, and many more are hospitalized. Flu vaccine can:  keep you from getting flu,  make flu less severe if you do get it, and  keep you from spreading flu to your family and other people. 2. Inactivated and recombinant flu vaccines A dose of flu vaccine is recommended every flu season. Children 6 months through 40 years of age may need two doses during the same flu season. Everyone else needs only one dose each flu season. Some inactivated flu vaccines contain a very small amount of a mercury-based preservative called thimerosal.  Studies have not shown thimerosal in vaccines to be harmful, but flu vaccines that do not contain thimerosal are available. There is no live flu virus in flu shots. They cannot cause the flu. There are many flu viruses, and they are always changing. Each year a new flu vaccine is made to protect against three or four viruses that are likely to cause disease in the upcoming flu season. But even when the vaccine doesn't exactly match these viruses, it may still provide some protection. Flu vaccine cannot prevent:  flu that is caused by a virus not covered by the vaccine, or  illnesses that look like flu but are not. It takes about 2 weeks for protection to develop after vaccination, and protection lasts through the flu season. 3. Some people should not get this vaccine Tell the person who is giving you the vaccine:  If you have any severe, life-threatening allergies. If you ever had a life-threatening allergic reaction after a dose of flu vaccine, or have a severe allergy to any part of this vaccine, you may be advised not to get vaccinated. Most, but not all, types of flu vaccine contain a small amount of egg protein.  If you ever had  Guillain-Barr Syndrome (also called GBS). Some people with a history of GBS should not get this vaccine. This should be discussed with your doctor.  If you are not feeling well. It is usually okay to get flu vaccine when you have a mild illness, but you might be asked to come back when you feel better. 4. Risks of a vaccine reaction With any medicine, including vaccines, there is a chance of reactions. These are usually mild and go away on their own, but serious reactions are also possible. Most people who get a flu shot do not have any problems with it. Minor problems following a flu shot include:  soreness, redness, or swelling where the shot was given  hoarseness  sore, red or itchy eyes  cough  fever  aches  headache  itching  fatigue If these  problems occur, they usually begin soon after the shot and last 1 or 2 days. More serious problems following a flu shot can include the following:  There may be a small increased risk of Guillain-Barre Syndrome (GBS) after inactivated flu vaccine. This risk has been estimated at 1 or 2 additional cases per million people vaccinated. This is much lower than the risk of severe complications from flu, which can be prevented by flu vaccine.  Young children who get the flu shot along with pneumococcal vaccine (PCV13) and/or DTaP vaccine at the same time might be slightly more likely to have a seizure caused by fever. Ask your doctor for more information. Tell your doctor if a child who is getting flu vaccine has ever had a seizure. Problems that could happen after any injected vaccine:  People sometimes faint after a medical procedure, including vaccination. Sitting or lying down for about 15 minutes can help prevent fainting, and injuries caused by a fall. Tell your doctor if you feel dizzy, or have vision changes or ringing in the ears.  Some people get severe pain in the shoulder and have difficulty moving the arm where a shot was given. This happens very rarely.  Any medication can cause a severe allergic reaction. Such reactions from a vaccine are very rare, estimated at about 1 in a million doses, and would happen within a few minutes to a few hours after the vaccination. As with any medicine, there is a very remote chance of a vaccine causing a serious injury or death. The safety of vaccines is always being monitored. For more information, visit: http://www.aguilar.org/ 5. What if there is a serious reaction? What should I look for? Look for anything that concerns you, such as signs of a severe allergic reaction, very high fever, or unusual behavior. Signs of a severe allergic reaction can include hives, swelling of the face and throat, difficulty breathing, a fast heartbeat, dizziness, and  weakness. These would start a few minutes to a few hours after the vaccination. What should I do?  If you think it is a severe allergic reaction or other emergency that can't wait, call 9-1-1 and get the person to the nearest hospital. Otherwise, call your doctor.  Reactions should be reported to the Vaccine Adverse Event Reporting System (VAERS). Your doctor should file this report, or you can do it yourself through the VAERS web site at www.vaers.SamedayNews.es, or by calling 873-112-6964.  VAERS does not give medical advice. 6. The National Vaccine Injury Compensation Program The [employer] Vaccine Injury Compensation Program (VICP) is a federal program that was created to compensate people who may have been injured by certain vaccines.  Persons who believe they may have been injured by a vaccine can learn about the program and about filing a claim by calling 669 879 7299 or visiting the [place] website at GoldCloset.com.ee. There is a time limit to file a claim for compensation. 7. How can I learn more?  Ask your healthcare provider. He or she can give you the vaccine package insert or suggest other sources of information.  Call your local or state health department.  Contact the Centers for Disease Control and Prevention (CDC):  Call 531-155-9620 (1-800-CDC-INFO) or  Visit CDC's website at https://gibson.com/ Vaccine Information Statement, Inactivated Influenza Vaccine (05/14/2014) This information is not intended to replace advice given to you by your health care provider. Make sure you discuss any questions you have with your health care provider. Document Released: 07/19/2006 Document Revised: 06/14/2016 Document Reviewed: 06/14/2016 Elsevier Interactive Patient Education  2017 [employer]   Pneumococcal Conjugate Vaccine (PCV13) What You Need to Know 1. Why get vaccinated? Vaccination can protect both children and adults from pneumococcal disease. Pneumococcal disease  is caused by bacteria that can spread from person to person through close contact. It can cause ear infections, and it can also lead to more serious infections of the:  Lungs (pneumonia),  Blood (bacteremia), and  Covering of the brain and spinal cord (meningitis). Pneumococcal pneumonia is most common among adults. Pneumococcal meningitis can cause deafness and brain damage, and it kills about 1 child in 10 who get it. Anyone can get pneumococcal disease, but children under 57 years of age and adults 32 years and older, people with certain medical conditions, and cigarette smokers are at the highest risk. Before there was a vaccine, the [place] saw:  more than 700 cases of meningitis,  about 13,000 blood infections,  about 5 million ear infections, and  about 200 deaths in children under 5 each year from pneumococcal disease. Since vaccine became available, severe pneumococcal disease in these children has fallen by 88%. About 18,000 older adults die of pneumococcal disease each year in the [place]. Treatment of pneumococcal infections with penicillin and other drugs is not as effective as it used to be, because some strains of the disease have become resistant to these drugs. This makes prevention of the disease, through vaccination, even more important. 2. PCV13 vaccine Pneumococcal conjugate vaccine (called PCV13) protects against 13 types of pneumococcal bacteria. PCV13 is routinely given to children at 2, 4, 6, and 61-30 months of age. It is also recommended for children and adults 58 to 20 years of age with certain health conditions, and for all adults 52 years of age and older. Your doctor can give you details. 3. Some people should not get this vaccine Anyone who has ever had a life-threatening allergic reaction to a dose of this vaccine, to an earlier pneumococcal vaccine called PCV7, or to any vaccine containing diphtheria toxoid (for example, DTaP), should not get  PCV13. Anyone with a severe allergy to any component of PCV13 should not get the vaccine. Tell your doctor if the person being vaccinated has any severe allergies. If the person scheduled for vaccination is not feeling well, your healthcare provider might decide to reschedule the shot on another day. 4. Risks of a vaccine reaction With any medicine, including vaccines, there is a chance of reactions. These are usually mild and go away on their own, but serious reactions are also possible. Problems reported following PCV13 varied by age and dose in the series. The most common problems reported  among children were:  About half became drowsy after the shot, had a temporary loss of appetite, or had redness or tenderness where the shot was given.  About 1 out of 3 had swelling where the shot was given.  About 1 out of 3 had a mild fever, and about 1 in 20 had a fever over 102.49F.  Up to about 8 out of 10 became fussy or irritable. Adults have reported pain, redness, and swelling where the shot was given; also mild fever, fatigue, headache, chills, or muscle pain. Young children who get PCV13 along with inactivated flu vaccine at the same time may be at increased risk for seizures caused by fever. Ask your doctor for more information. Problems that could happen after any vaccine:  People sometimes faint after a medical procedure, including vaccination. Sitting or lying down for about 15 minutes can help prevent fainting, and injuries caused by a fall. Tell your doctor if you feel dizzy, or have vision changes or ringing in the ears.  Some older children and adults get severe pain in the shoulder and have difficulty moving the arm where a shot was given. This happens very rarely.  Any medication can cause a severe allergic reaction. Such reactions from a vaccine are very rare, estimated at about 1 in a million doses, and would happen within a few minutes to a few hours after the vaccination. As with  any medicine, there is a very small chance of a vaccine causing a serious injury or death. The safety of vaccines is always being monitored. For more information, visit: http://www.aguilar.org/ 5. What if there is a serious reaction? What should I look for? Look for anything that concerns you, such as signs of a severe allergic reaction, very high fever, or unusual behavior. Signs of a severe allergic reaction can include hives, swelling of the face and throat, difficulty breathing, a fast heartbeat, dizziness, and weakness-usually within a few minutes to a few hours after the vaccination. What should I do?  If you think it is a severe allergic reaction or other emergency that can't wait, call 9-1-1 or get the person to the nearest hospital. Otherwise, call your doctor.  Reactions should be reported to the Vaccine Adverse Event Reporting System (VAERS). Your doctor should file this report, or you can do it yourself through the VAERS web site at www.vaers.SamedayNews.es, or by calling 3170266263.  VAERS does not give medical advice. 6. The National Vaccine Injury Compensation Program The [employer] Vaccine Injury Compensation Program (VICP) is a federal program that was created to compensate people who may have been injured by certain vaccines. Persons who believe they may have been injured by a vaccine can learn about the program and about filing a claim by calling 3646866127 or visiting the [place] website at GoldCloset.com.ee. There is a time limit to file a claim for compensation. 7. How can I learn more?  Ask your healthcare provider. He or she can give you the vaccine package insert or suggest other sources of information.  Call your local or state health department.  Contact the Centers for Disease Control and Prevention (CDC):  Call (845) 554-5917 (1-800-CDC-INFO) or  Visit CDC's website at http://hunter.com/ Vaccine Information Statement, PCV13 Vaccine  (08/12/2014) This information is not intended to replace advice given to you by your health care provider. Make sure you discuss any questions you have with your health care provider. Document Released: 07/22/2006 Document Revised: 06/14/2016 Document Reviewed: 06/14/2016 Elsevier Interactive Patient Education  2017 [employer].

## 2016-10-12 ENCOUNTER — Other Ambulatory Visit: Payer: Self-pay | Admitting: Family Medicine

## 2016-10-12 DIAGNOSIS — E785 Hyperlipidemia, unspecified: Secondary | ICD-10-CM

## 2016-10-17 DIAGNOSIS — H2512 Age-related nuclear cataract, left eye: Secondary | ICD-10-CM | POA: Diagnosis not present

## 2016-10-17 DIAGNOSIS — H25812 Combined forms of age-related cataract, left eye: Secondary | ICD-10-CM | POA: Diagnosis not present

## 2016-11-06 ENCOUNTER — Encounter: Payer: Self-pay | Admitting: Family Medicine

## 2017-02-15 ENCOUNTER — Other Ambulatory Visit: Payer: Medicare HMO

## 2017-02-20 DIAGNOSIS — R69 Illness, unspecified: Secondary | ICD-10-CM | POA: Diagnosis not present

## 2017-02-23 DIAGNOSIS — H52209 Unspecified astigmatism, unspecified eye: Secondary | ICD-10-CM | POA: Diagnosis not present

## 2017-02-23 DIAGNOSIS — H524 Presbyopia: Secondary | ICD-10-CM | POA: Diagnosis not present

## 2017-02-23 DIAGNOSIS — H5213 Myopia, bilateral: Secondary | ICD-10-CM | POA: Diagnosis not present

## 2017-06-05 DIAGNOSIS — Z01419 Encounter for gynecological examination (general) (routine) without abnormal findings: Secondary | ICD-10-CM | POA: Diagnosis not present

## 2017-06-05 DIAGNOSIS — Z6821 Body mass index (BMI) 21.0-21.9, adult: Secondary | ICD-10-CM | POA: Diagnosis not present

## 2017-08-13 DIAGNOSIS — Z1231 Encounter for screening mammogram for malignant neoplasm of breast: Secondary | ICD-10-CM | POA: Diagnosis not present

## 2017-08-13 LAB — HM MAMMOGRAPHY

## 2017-08-26 DIAGNOSIS — R69 Illness, unspecified: Secondary | ICD-10-CM | POA: Diagnosis not present

## 2017-08-28 ENCOUNTER — Encounter: Payer: Self-pay | Admitting: Family Medicine

## 2017-11-06 DIAGNOSIS — R69 Illness, unspecified: Secondary | ICD-10-CM | POA: Diagnosis not present

## 2017-12-10 ENCOUNTER — Ambulatory Visit (INDEPENDENT_AMBULATORY_CARE_PROVIDER_SITE_OTHER): Payer: Medicare HMO | Admitting: Family Medicine

## 2017-12-10 ENCOUNTER — Encounter: Payer: Self-pay | Admitting: Family Medicine

## 2017-12-10 VITALS — BP 140/82 | HR 88 | Temp 98.2°F | Resp 16 | Ht 67.0 in | Wt 129.8 lb

## 2017-12-10 DIAGNOSIS — E785 Hyperlipidemia, unspecified: Secondary | ICD-10-CM

## 2017-12-10 DIAGNOSIS — M25569 Pain in unspecified knee: Secondary | ICD-10-CM

## 2017-12-10 DIAGNOSIS — Z0000 Encounter for general adult medical examination without abnormal findings: Secondary | ICD-10-CM | POA: Diagnosis not present

## 2017-12-10 MED ORDER — MELOXICAM 15 MG PO TABS: 15.0000 mg | ORAL_TABLET | Freq: Every day | ORAL | 3 refills | Status: DC

## 2017-12-10 NOTE — Progress Notes (Signed)
Subjective:  I acted as a Neurosurgeon for [employer]. Fuller Song, RMA   Patient ID: Tonya Sheppard, female    DOB: 1946-07-11, 72 y.o.   MRN: [id number]  Chief Complaint  Patient presents with  . Annual Exam    HPI  Patient is in today for annual exam.  Patient Care Team: Zola Button, Grayling Congress, DO as PCP - General Doristine Section, MD as Consulting Physician (Orthopedic Surgery) Richarda Overlie, MD as Consulting Physician (Obstetrics and Gynecology) Nelson Chimes, MD as Consulting Physician (Ophthalmology)   Past Medical History:  Diagnosis Date  . Osteopenia     Past Surgical History:  Procedure Laterality Date  . CATARACT EXTRACTION    . EYE SURGERY     cateract right eye  . TUBAL LIGATION      Family History  Problem Relation Age of Onset  . Hypertension Mother   . Heart disease Mother   . Heart disease Father 59       MI  . Cancer Sister 49       ovarian  . Hepatitis Brother   . Alcohol abuse Brother   . Alcohol abuse Sister   . Colon cancer Neg Hx     Social History   Socioeconomic History  . Marital status: Married    Spouse name: Not on file  . Number of children: Not on file  . Years of education: Not on file  . Highest education level: Not on file  Social Needs  . Financial resource strain: Not on file  . Food insecurity - worry: Not on file  . Food insecurity - inability: Not on file  . Transportation needs - medical: Not on file  . Transportation needs - non-medical: Not on file  Occupational History  . Occupation: financial analyst--retired    Employer: [employer]  Tobacco Use  . Smoking status: Never Smoker  . Smokeless tobacco: Never Used  Substance and Sexual Activity  . Alcohol use: Yes    Alcohol/week: 4.2 - 8.4 oz    Types: 7 - 14 Glasses of wine per week  . Drug use: No  . Sexual activity: Yes    Partners: Male  Other Topics Concern  . Not on file  Social History Narrative   Exercise- [employer], yard work    Outpatient  Medications Prior to Visit  Medication Sig Dispense Refill  . alendronate (FOSAMAX) 70 MG tablet     . meloxicam (MOBIC) 15 MG tablet Take 1 tablet (15 mg total) by mouth daily. 30 tablet 3   No facility-administered medications prior to visit.     No Known Allergies  ROS     Objective:    Physical Exam  Constitutional: She is oriented to person, place, and time. She appears well-developed and well-nourished.  HENT:  Head: Normocephalic and atraumatic.  Eyes: Conjunctivae and EOM are normal.  Neck: Normal range of motion. Neck supple. No JVD present. Carotid bruit is not present. No thyromegaly present.  Cardiovascular: Normal rate, regular rhythm and normal heart sounds.  No murmur heard. Pulmonary/Chest: Effort normal and breath sounds normal. No respiratory distress. She has no wheezes. She has no rales. She exhibits no tenderness.  Musculoskeletal: She exhibits no edema.  Neurological: She is alert and oriented to person, place, and time.  Psychiatric: She has a normal mood and affect.  Nursing note and vitals reviewed.   BP 140/82 (BP Location: Left Arm, Patient Position: Sitting, Cuff Size: Normal)   Pulse 88  Temp 98.2 F (36.8 C) (Oral)   Resp 16   Ht 5\' 7"  (1.702 m)   Wt 129 lb 12.8 oz (58.9 kg)   SpO2 96%   BMI 20.33 kg/m  Wt Readings from Last 3 Encounters:  12/10/17 129 lb 12.8 oz (58.9 kg)  10/11/16 131 lb 9.6 oz (59.7 kg)  03/01/16 133 lb 6.4 oz (60.5 kg)   BP Readings from Last 3 Encounters:  12/10/17 140/82  10/11/16 139/85  03/01/16 (!) 148/92     Immunization History  Administered Date(s) Administered  . Influenza Whole 08/16/2010, 07/27/2011  . Influenza, High Dose Seasonal PF 10/11/2016  . Influenza-Unspecified 06/08/2013  . Pneumococcal Conjugate-13 10/11/2016  . Pneumococcal Polysaccharide-23 12/24/2013  . Tdap 08/28/2011  . Zoster 06/20/2010    Health Maintenance  Topic Date Due  . Hepatitis C Screening  10/11/2021 (Originally  Aug 30, 1946)  . MAMMOGRAM  08/14/2019  . TETANUS/TDAP  08/27/2021  . COLONOSCOPY  10/23/2025  . INFLUENZA VACCINE  Completed  . DEXA SCAN  Completed  . PNA vac Low Risk Adult  Completed    Lab Results  Component Value Date   WBC 7.5 10/11/2016   HGB 13.3 10/11/2016   HCT 39.8 10/11/2016   PLT 286.0 10/11/2016   GLUCOSE 100 (H) 10/11/2016   CHOL 276 (H) 10/11/2016   TRIG 63.0 10/11/2016   HDL 82.80 10/11/2016   LDLDIRECT 164.1 08/28/2011   LDLCALC 181 (H) 10/11/2016   ALT 13 10/11/2016   AST 16 10/11/2016   NA 140 10/11/2016   K 3.7 10/11/2016   CL 105 10/11/2016   CREATININE 0.71 10/11/2016   BUN 16 10/11/2016   CO2 29 10/11/2016   TSH 0.92 08/28/2011    Lab Results  Component Value Date   TSH 0.92 08/28/2011   Lab Results  Component Value Date   WBC 7.5 10/11/2016   HGB 13.3 10/11/2016   HCT 39.8 10/11/2016   MCV 90.7 10/11/2016   PLT 286.0 10/11/2016   Lab Results  Component Value Date   NA 140 10/11/2016   K 3.7 10/11/2016   CO2 29 10/11/2016   GLUCOSE 100 (H) 10/11/2016   BUN 16 10/11/2016   CREATININE 0.71 10/11/2016   BILITOT 0.5 10/11/2016   ALKPHOS 38 (L) 10/11/2016   AST 16 10/11/2016   ALT 13 10/11/2016   PROT 7.5 10/11/2016   ALBUMIN 4.4 10/11/2016   CALCIUM 9.6 10/11/2016   GFR 86.49 10/11/2016   Lab Results  Component Value Date   CHOL 276 (H) 10/11/2016   Lab Results  Component Value Date   HDL 82.80 10/11/2016   Lab Results  Component Value Date   LDLCALC 181 (H) 10/11/2016   Lab Results  Component Value Date   TRIG 63.0 10/11/2016   Lab Results  Component Value Date   CHOLHDL 3 10/11/2016   No results found for: HGBA1C       Assessment & Plan:   Problem List Items Addressed This Visit    None    Visit Diagnoses    Hyperlipidemia LDL goal <100    -  Primary   Relevant Orders   Lipid panel   Comprehensive metabolic panel   Knee pain       Relevant Medications   meloxicam (MOBIC) 15 MG tablet      I am  having Tonya Sheppard maintain her alendronate and meloxicam.  Meds ordered this encounter  Medications  . meloxicam (MOBIC) 15 MG tablet    Sig: Take  1 tablet (15 mg total) by mouth daily.    Dispense:  30 tablet    Refill:  3    CMA served as scribe during this visit. History, Physical and Plan performed by medical provider. Documentation and orders reviewed and attested to.  Donato Schultz, DO  Subjective:     Tonya Sheppard is a 72 y.o. female and is here for a comprehensive physical exam. The patient reports no problems.  Social History   Socioeconomic History  . Marital status: Married    Spouse name: Not on file  . Number of children: Not on file  . Years of education: Not on file  . Highest education level: Not on file  Social Needs  . Financial resource strain: Not on file  . Food insecurity - worry: Not on file  . Food insecurity - inability: Not on file  . Transportation needs - medical: Not on file  . Transportation needs - non-medical: Not on file  Occupational History  . Occupation: financial analyst--retired    Employer: [employer]  Tobacco Use  . Smoking status: Never Smoker  . Smokeless tobacco: Never Used  Substance and Sexual Activity  . Alcohol use: Yes    Alcohol/week: 4.2 - 8.4 oz    Types: 7 - 14 Glasses of wine per week  . Drug use: No  . Sexual activity: Yes    Partners: Male  Other Topics Concern  . Not on file  Social History Narrative   Exercise- [employer], yard work   Health Maintenance  Topic Date Due  . Hepatitis C Screening  10/11/2021 (Originally [birth year])  . MAMMOGRAM  08/14/2019  . TETANUS/TDAP  08/27/2021  . COLONOSCOPY  10/23/2025  . INFLUENZA VACCINE  Completed  . DEXA SCAN  Completed  . PNA vac Low Risk Adult  Completed    The following portions of the patient's history were reviewed and updated as appropriate:  She  has a past medical history of Osteopenia. She does not have any pertinent problems  on file. She  has a past surgical history that includes Tubal ligation; Eye surgery; and Cataract extraction. Her family history includes Alcohol abuse in her brother and sister; Cancer (age of onset: 81) in her sister; Heart disease in her mother; Heart disease (age of onset: 108) in her father; Hepatitis in her brother; Hypertension in her mother. She  reports that  has never smoked. she has never used smokeless tobacco. She reports that she drinks about 4.2 - 8.4 oz of alcohol per week. She reports that she does not use drugs. She has a current medication list which includes the following prescription(s): alendronate and meloxicam. Current Outpatient Medications on File Prior to Visit  Medication Sig Dispense Refill  . alendronate (FOSAMAX) 70 MG tablet      No current facility-administered medications on file prior to visit.    She has No Known Allergies..  Review of Systems Review of Systems  Constitutional: Negative for activity change, appetite change and fatigue.  HENT: Negative for hearing loss, congestion, tinnitus and ear discharge.  dentist q22m Eyes: Negative for visual disturbance (see optho q1y -- vision corrected to 20/20 with glasses).  Respiratory: Negative for cough, chest tightness and shortness of breath.   Cardiovascular: Negative for chest pain, palpitations and leg swelling.  Gastrointestinal: Negative for abdominal pain, diarrhea, constipation and abdominal distention.  Genitourinary: Negative for urgency, frequency, decreased urine volume and difficulty urinating.  Musculoskeletal: Negative for back pain, arthralgias  and gait problem.  Skin: Negative for color change, pallor and rash.  Neurological: Negative for dizziness, light-headedness, numbness and headaches.  Hematological: Negative for adenopathy. Does not bruise/bleed easily.  Psychiatric/Behavioral: Negative for suicidal ideas, confusion, sleep disturbance, self-injury, dysphoric mood, decreased  concentration and agitation.       Objective:    BP 140/82 (BP Location: Left Arm, Patient Position: Sitting, Cuff Size: Normal)   Pulse 88   Temp 98.2 F (36.8 C) (Oral)   Resp 16   Ht 5\' 7"  (1.702 m)   Wt 129 lb 12.8 oz (58.9 kg)   SpO2 96%   BMI 20.33 kg/m  General appearance: alert, cooperative and appears stated age Head: Normocephalic, without obvious abnormality, atraumatic Eyes: negative findings: lids and lashes normal, conjunctivae and sclerae normal and pupils equal, round, reactive to light and accomodation Ears: normal TM's and external ear canals both ears Nose: Nares normal. Septum midline. Mucosa normal. No drainage or sinus tenderness. Throat: lips, mucosa, and tongue normal; teeth and gums normal Neck: no adenopathy, no carotid bruit, no JVD, supple, symmetrical, trachea midline and thyroid not enlarged, symmetric, no tenderness/mass/nodules Back: symmetric, no curvature. ROM normal. No CVA tenderness. Lungs: clear to auscultation bilaterally Breasts: gyn Heart: regular rate and rhythm, S1, S2 normal, no murmur, click, rub or gallop Abdomen: soft, non-tender; bowel sounds normal; no masses,  no organomegaly Pelvic: deferred Extremities: extremities normal, atraumatic, no cyanosis or edema Pulses: 2+ and symmetric Skin: Skin color, texture, turgor normal. No rashes or lesions Lymph nodes: Cervical, supraclavicular, and axillary nodes normal. Neurologic: Alert and oriented X 3, normal strength and tone. Normal symmetric reflexes. Normal coordination and gait    Assessment:    Healthy female exam.      Plan:   ghm  utd Check labs See After Visit Summary for Counseling Recommendations    1. Knee pain stable - meloxicam (MOBIC) 15 MG tablet; Take 1 tablet (15 mg total) by mouth daily.  Dispense: 30 tablet; Refill: 3  2. Hyperlipidemia LDL goal <100 Check labs Encouraged heart healthy diet, increase exercise, avoid trans fats, consider a krill oil cap  daily - Lipid panel - Comprehensive metabolic panel  3.  Elevated bp Dash diet  Recheck 2-3 weeks

## 2017-12-10 NOTE — Patient Instructions (Signed)

## 2017-12-11 LAB — COMPREHENSIVE METABOLIC PANEL
ALK PHOS: 55 U/L (ref 39–117)
ALT: 9 U/L (ref 0–35)
AST: 11 U/L (ref 0–37)
Albumin: 4.3 g/dL (ref 3.5–5.2)
BILIRUBIN TOTAL: 0.7 mg/dL (ref 0.2–1.2)
BUN: 18 mg/dL (ref 6–23)
CO2: 29 mEq/L (ref 19–32)
Calcium: 9.7 mg/dL (ref 8.4–10.5)
Chloride: 104 mEq/L (ref 96–112)
Creatinine, Ser: 0.67 mg/dL (ref 0.40–1.20)
GFR: 92.16 mL/min (ref >60.00)
GLUCOSE: 91 mg/dL (ref 70–99)
Potassium: 4 mEq/L (ref 3.5–5.1)
SODIUM: 138 meq/L (ref 135–145)
TOTAL PROTEIN: 7.5 g/dL (ref 6.0–8.3)

## 2017-12-11 LAB — LIPID PANEL
Cholesterol: 243 mg/dL — ABNORMAL HIGH (ref 0–200)
HDL: 83.5 mg/dL (ref >39.00)
LDL Cholesterol: 146 mg/dL — ABNORMAL HIGH (ref 0–99)
NONHDL: 159.75
Total CHOL/HDL Ratio: 3
Triglycerides: 69 mg/dL (ref 0.0–149.0)
VLDL: 13.8 mg/dL (ref 0.0–40.0)

## 2018-01-08 ENCOUNTER — Ambulatory Visit: Payer: Medicare HMO

## 2018-01-09 DIAGNOSIS — M25511 Pain in right shoulder: Secondary | ICD-10-CM | POA: Diagnosis not present

## 2018-01-10 ENCOUNTER — Ambulatory Visit (INDEPENDENT_AMBULATORY_CARE_PROVIDER_SITE_OTHER): Payer: Medicare HMO | Admitting: Family Medicine

## 2018-01-10 DIAGNOSIS — R030 Elevated blood-pressure reading, without diagnosis of hypertension: Secondary | ICD-10-CM

## 2018-01-10 DIAGNOSIS — H52223 Regular astigmatism, bilateral: Secondary | ICD-10-CM | POA: Diagnosis not present

## 2018-01-10 DIAGNOSIS — H524 Presbyopia: Secondary | ICD-10-CM | POA: Diagnosis not present

## 2018-01-10 DIAGNOSIS — H5213 Myopia, bilateral: Secondary | ICD-10-CM | POA: Diagnosis not present

## 2018-01-10 MED ORDER — LISINOPRIL 10 MG PO TABS: 10.0000 mg | ORAL_TABLET | Freq: Every day | ORAL | 0 refills | Status: DC

## 2018-01-10 NOTE — Progress Notes (Signed)
Reviewed  Sophiarose Eades R Lowne Chase, DO  

## 2018-01-10 NOTE — Progress Notes (Signed)
Pre visit review using our clinic review tool, if applicable. No additional management support is needed unless otherwise documented below in the visit note.   BP Readings from Last 3 Encounters:  12/10/17 140/82  10/11/16 139/85  03/01/16 (!) 148/92     Pt here for 2-3 week bp recheck per Dr. Laury AxonLowne.  Pt not currently on any blood pressure medication.  Blood pressure today: 10:09 AM: BP 158/84, pulse: 68 10:19 AM: BP 147/84, pulse: 70  Dr. Laury AxonLowne would like you to start Lisinopril 10 mg tablets once daily. Return in 2-3 weeks for nurse visit. Follow up with Dr. Laury AxonLowne in 3 months.    BP recheck scheduled for April 26th at 2:30 pm and 3 month follow up with Dr. Laury AxonLowne scheduled for July 22nd at 2pm.

## 2018-01-10 NOTE — Patient Instructions (Signed)
Dr. Laury AxonLowne would like you to start Lisinopril 10 mg tablets once daily. Return in 2-3 weeks for nurse visit. Follow up with Dr. Laury AxonLowne in 3 months.    BP recheck scheduled for April 26th at 2:30 pm and 3 month follow up with Dr. Laury AxonLowne scheduled for July 22nd at 2pm.

## 2018-01-23 ENCOUNTER — Telehealth: Payer: Self-pay | Admitting: Family Medicine

## 2018-01-23 NOTE — Telephone Encounter (Signed)
Copied from CRM [phone]. Topic: Quick Communication - See Telephone Encounter >> Jan 23, 2018  4:11 PM Louie BunPalacios Medina, Rosey Batheresa D wrote: CRM for notification. See Telephone encounter for: 01/23/18. Patient called and that she would like to talk to Dr. Laury AxonLowne or her CMA about the medication lisinopril (PRINIVIL,ZESTRIL) 10 MG tablet. She feel that is not working for her. Please call patient back.

## 2018-01-27 ENCOUNTER — Telehealth: Payer: Self-pay | Admitting: *Deleted

## 2018-01-27 NOTE — Telephone Encounter (Signed)
Copied from CRM [phone]. Topic: Inquiry >> Jan 27, 2018 12:48 PM Landry MellowFoltz, Melissa J wrote: Reason for CRM: pt received letter that Dr Laury AxonLowne is no longer accepting [facility] [employer] and would like call back about this - please call [phone]

## 2018-01-28 ENCOUNTER — Encounter: Payer: Self-pay | Admitting: Family Medicine

## 2018-01-29 ENCOUNTER — Encounter: Payer: Self-pay | Admitting: *Deleted

## 2018-01-29 NOTE — Telephone Encounter (Signed)
Patient states that since starting the lisinopril she cannot sleep, very tired, and achy.  She took a half a tablet and checked her pressure and it was 117/80 and seem to be better.  She asked is it ok to take the 1/2 tab.

## 2018-01-29 NOTE — Telephone Encounter (Signed)
Advised patient that we were not aware of the issue.  We currently think it is a credentialing issue and we are working on this.  We will call her to update her once we hear something back.

## 2018-01-30 NOTE — Telephone Encounter (Signed)
Patient has a nurse appt tomorrow and she has been taking the 1/2 tab only for a couple of days.  Do you want to change her appt to 1 or 2 weeks?

## 2018-01-30 NOTE — Telephone Encounter (Signed)
appt made for 02/18/18.  [place] be [place] she get [place]

## 2018-01-30 NOTE — Telephone Encounter (Signed)
Yes-- we can push it a a bit

## 2018-01-30 NOTE — Telephone Encounter (Signed)
Yes-- and she should come in for bp check with nurse

## 2018-01-31 ENCOUNTER — Ambulatory Visit: Payer: Medicare HMO

## 2018-02-05 NOTE — Telephone Encounter (Signed)
Left message with husband that insurance issue has been resolved

## 2018-02-18 ENCOUNTER — Ambulatory Visit (INDEPENDENT_AMBULATORY_CARE_PROVIDER_SITE_OTHER): Payer: Medicare HMO | Admitting: Family Medicine

## 2018-02-18 DIAGNOSIS — I10 Essential (primary) hypertension: Secondary | ICD-10-CM | POA: Diagnosis not present

## 2018-02-18 NOTE — Progress Notes (Signed)
Pt here for blood pressure check per Dr Zola Button.  BP Readings from Last 3 Encounters:  12/10/17 140/82  10/11/16 139/85  03/01/16 (!) 148/92   Pt currently taking: Lisinopril  1/2 tablet daily.  She was initially advised to take Lisinopril  once a day but pt called back complaining of fatigue with the whole table and had cut it back to 1/2 tablet every day and fatigue improved.  BP today = 133/84 HR = 67  Pt also reports red, itchy rash that developed on Sunday that she thinks is poison oak / ivy. Has a couple of dried lesions but no current blisters or weeping from rash.  She has been using witch hazel and hydrocortisone. Pt would like to know what to take for her itching and declines Rx like prednisone dosepack at this time.  Advised pt to continue Lisinopril  1/2 tablet daily and follow up as scheduled on 04/28/18. She may continue otc treatment for her rash that she has been doing at home and add either benadryl or zyrtec for her itching. Pt will contact us if rash does not continue to improve.   reviewed Donato Schultz, DO

## 2018-02-19 ENCOUNTER — Other Ambulatory Visit: Payer: Self-pay | Admitting: Family Medicine

## 2018-02-21 ENCOUNTER — Other Ambulatory Visit: Payer: Self-pay | Admitting: Family Medicine

## 2018-03-17 DIAGNOSIS — R69 Illness, unspecified: Secondary | ICD-10-CM | POA: Diagnosis not present

## 2018-04-23 DIAGNOSIS — R69 Illness, unspecified: Secondary | ICD-10-CM | POA: Diagnosis not present

## 2018-04-28 ENCOUNTER — Ambulatory Visit (INDEPENDENT_AMBULATORY_CARE_PROVIDER_SITE_OTHER): Payer: Medicare HMO | Admitting: Family Medicine

## 2018-04-28 ENCOUNTER — Encounter: Payer: Self-pay | Admitting: Family Medicine

## 2018-04-28 VITALS — BP 134/79 | HR 72 | Temp 98.4°F | Ht 67.0 in | Wt 130.8 lb

## 2018-04-28 DIAGNOSIS — I10 Essential (primary) hypertension: Secondary | ICD-10-CM | POA: Insufficient documentation

## 2018-04-28 NOTE — Assessment & Plan Note (Signed)
Well controlled, no changes to meds. Encouraged heart healthy diet such as the DASH diet and exercise as tolerated.  °

## 2018-04-28 NOTE — Patient Instructions (Signed)
DASH Eating Plan DASH stands for "Dietary Approaches to Stop Hypertension." The DASH eating plan is a healthy eating plan that has been shown to reduce high blood pressure (hypertension). It may also reduce your risk for type 2 diabetes, heart disease, and stroke. The DASH eating plan may also help with weight loss. What are tips for following this plan? General guidelines  Avoid eating more than 2,300 mg (milligrams) of salt (sodium) a day. If you have hypertension, you may need to reduce your sodium intake to 1,500 mg a day.  Limit alcohol intake to no more than 1 drink a day for nonpregnant women and 2 drinks a day for men. One drink equals 12 oz of beer, 5 oz of wine, or 1 oz of hard liquor.  Work with your health care provider to maintain a healthy body weight or to lose weight. Ask what an ideal weight is for you.  Get at least 30 minutes of exercise that causes your heart to beat faster (aerobic exercise) most days of the week. Activities may include walking, swimming, or biking.  Work with your health care provider or diet and nutrition specialist (dietitian) to adjust your eating plan to your individual calorie needs. Reading food labels  Check food labels for the amount of sodium per serving. Choose foods with less than 5 percent of the Daily Value of sodium. Generally, foods with less than 300 mg of sodium per serving fit into this eating plan.  To find whole grains, look for the word "whole" as the first word in the ingredient list. Shopping  Buy products labeled as "low-sodium" or "no salt added."  Buy fresh foods. Avoid canned foods and premade or frozen meals. Cooking  Avoid adding salt when cooking. Use salt-free seasonings or herbs instead of table salt or sea salt. Check with your health care provider or pharmacist before using salt substitutes.  Do not fry foods. Cook foods using healthy methods such as baking, boiling, grilling, and broiling instead.  Cook with  heart-healthy oils, such as olive, canola, soybean, or sunflower oil. Meal planning   Eat a balanced diet that includes: ? 5 or more servings of fruits and vegetables each day. At each meal, try to fill half of your plate with fruits and vegetables. ? Up to 6-8 servings of whole grains each day. ? Less than 6 oz of lean meat, poultry, or fish each day. A 3-oz serving of meat is about the same size as a deck of cards. One egg equals 1 oz. ? 2 servings of low-fat dairy each day. ? A serving of nuts, seeds, or beans 5 times each week. ? Heart-healthy fats. Healthy fats called Omega-3 fatty acids are found in foods such as flaxseeds and coldwater fish, like sardines, salmon, and mackerel.  Limit how much you eat of the following: ? Canned or prepackaged foods. ? Food that is high in trans fat, such as fried foods. ? Food that is high in saturated fat, such as fatty meat. ? Sweets, desserts, sugary drinks, and other foods with added sugar. ? Full-fat dairy products.  Do not salt foods before eating.  Try to eat at least 2 vegetarian meals each week.  Eat more home-cooked food and less restaurant, buffet, and fast food.  When eating at a restaurant, ask that your food be prepared with less salt or no salt, if possible. What foods are recommended? The items listed may not be a complete list. Talk with your dietitian about what   dietary choices are best for you. Grains Whole-grain or whole-wheat bread. Whole-grain or whole-wheat pasta. Brown rice. Oatmeal. Quinoa. Bulgur. Whole-grain and low-sodium cereals. Pita bread. Low-fat, low-sodium crackers. Whole-wheat flour tortillas. Vegetables Fresh or frozen vegetables (raw, steamed, roasted, or grilled). Low-sodium or reduced-sodium tomato and vegetable juice. Low-sodium or reduced-sodium tomato sauce and tomato paste. Low-sodium or reduced-sodium canned vegetables. Fruits All fresh, dried, or frozen fruit. Canned fruit in natural juice (without  added sugar). Meat and other protein foods Skinless chicken or turkey. Ground chicken or turkey. Pork with fat trimmed off. Fish and seafood. Egg whites. Dried beans, peas, or lentils. Unsalted nuts, nut butters, and seeds. Unsalted canned beans. Lean cuts of beef with fat trimmed off. Low-sodium, lean deli meat. Dairy Low-fat (1%) or fat-free (skim) milk. Fat-free, low-fat, or reduced-fat cheeses. Nonfat, low-sodium ricotta or cottage cheese. Low-fat or nonfat yogurt. Low-fat, low-sodium cheese. Fats and oils Soft margarine without trans fats. Vegetable oil. Low-fat, reduced-fat, or light mayonnaise and salad dressings (reduced-sodium). Canola, safflower, olive, soybean, and sunflower oils. Avocado. Seasoning and other foods Herbs. Spices. Seasoning mixes without salt. Unsalted popcorn and pretzels. Fat-free sweets. What foods are not recommended? The items listed may not be a complete list. Talk with your dietitian about what dietary choices are best for you. Grains Baked goods made with fat, such as croissants, muffins, or some breads. Dry pasta or rice meal packs. Vegetables Creamed or fried vegetables. Vegetables in a cheese sauce. Regular canned vegetables (not low-sodium or reduced-sodium). Regular canned tomato sauce and paste (not low-sodium or reduced-sodium). Regular tomato and vegetable juice (not low-sodium or reduced-sodium). Pickles. Olives. Fruits Canned fruit in a light or heavy syrup. Fried fruit. Fruit in cream or butter sauce. Meat and other protein foods Fatty cuts of meat. Ribs. Fried meat. Bacon. Sausage. Bologna and other processed lunch meats. Salami. Fatback. Hotdogs. Bratwurst. Salted nuts and seeds. Canned beans with added salt. Canned or smoked fish. Whole eggs or egg yolks. Chicken or turkey with skin. Dairy Whole or 2% milk, cream, and half-and-half. Whole or full-fat cream cheese. Whole-fat or sweetened yogurt. Full-fat cheese. Nondairy creamers. Whipped toppings.  Processed cheese and cheese spreads. Fats and oils Butter. Stick margarine. Lard. Shortening. Ghee. Bacon fat. Tropical oils, such as coconut, palm kernel, or palm oil. Seasoning and other foods Salted popcorn and pretzels. Onion salt, garlic salt, seasoned salt, table salt, and sea salt. Worcestershire sauce. Tartar sauce. Barbecue sauce. Teriyaki sauce. Soy sauce, including reduced-sodium. Steak sauce. Canned and packaged gravies. Fish sauce. Oyster sauce. Cocktail sauce. Horseradish that you find on the shelf. Ketchup. Mustard. Meat flavorings and tenderizers. Bouillon cubes. Hot sauce and Tabasco sauce. Premade or packaged marinades. Premade or packaged taco seasonings. Relishes. Regular salad dressings. Where to find more information:  National Heart, Lung, and Blood Institute: www.nhlbi.nih.gov  American Heart Association: www.heart.org Summary  The DASH eating plan is a healthy eating plan that has been shown to reduce high blood pressure (hypertension). It may also reduce your risk for type 2 diabetes, heart disease, and stroke.  With the DASH eating plan, you should limit salt (sodium) intake to 2,300 mg a day. If you have hypertension, you may need to reduce your sodium intake to 1,500 mg a day.  When on the DASH eating plan, aim to eat more fresh fruits and vegetables, whole grains, lean proteins, low-fat dairy, and heart-healthy fats.  Work with your health care provider or diet and nutrition specialist (dietitian) to adjust your eating plan to your individual   calorie needs. This information is not intended to replace advice given to you by your health care provider. Make sure you discuss any questions you have with your health care provider. Document Released: 09/13/2011 Document Revised: 09/17/2016 Document Reviewed: 09/17/2016 Elsevier Interactive Patient Education  2018 [employer]  

## 2018-04-28 NOTE — Progress Notes (Signed)
Patient ID: Tonya Sheppard, female    DOB: Sep 23, 1946  Age: 72 y.o. MRN: [id number]    Subjective:  Subjective  HPI CHASMINE LENDER presents for bp f/u  Review of Systems  Constitutional: Negative for appetite change, diaphoresis, fatigue and unexpected weight change.  Eyes: Negative for pain, redness and visual disturbance.  Respiratory: Negative for cough, chest tightness, shortness of breath and wheezing.   Cardiovascular: Negative for chest pain, palpitations and leg swelling.  Endocrine: Negative for cold intolerance, heat intolerance, polydipsia, polyphagia and polyuria.  Genitourinary: Negative for difficulty urinating, dysuria and frequency.  Neurological: Negative for dizziness, light-headedness, numbness and headaches.    History Past Medical History:  Diagnosis Date  . Osteopenia     She has a past surgical history that includes Tubal ligation; Eye surgery; and Cataract extraction.   Her family history includes Alcohol abuse in her brother and sister; Cancer (age of onset: 23) in her sister; Heart disease in her mother; Heart disease (age of onset: 14) in her father; Hepatitis in her brother; Hypertension in her mother.She reports that she has never smoked. She has never used smokeless tobacco. She reports that she drinks about 4.2 - 8.4 oz of alcohol per week. She reports that she does not use drugs.  Current Outpatient Medications on File Prior to Visit  Medication Sig Dispense Refill  . alendronate (FOSAMAX) 70 MG tablet     . lisinopril (PRINIVIL,ZESTRIL) 10 MG tablet TAKE ONE TABLET BY MOUTH DAILY 90 tablet 1  . meloxicam (MOBIC) 15 MG tablet Take 1 tablet (15 mg total) by mouth daily. 30 tablet 3   No current facility-administered medications on file prior to visit.      Objective:  Objective  Physical Exam  Constitutional: She is oriented to person, place, and time. She appears well-developed and well-nourished.  HENT:  Head: Normocephalic and atraumatic.    Eyes: Conjunctivae and EOM are normal.  Neck: Normal range of motion. Neck supple. No JVD present. Carotid bruit is not present. No thyromegaly present.  Cardiovascular: Normal rate, regular rhythm and normal heart sounds.  No murmur heard. Pulmonary/Chest: Effort normal and breath sounds normal. No respiratory distress. She has no wheezes. She has no rales. She exhibits no tenderness.  Musculoskeletal: She exhibits no edema.  Neurological: She is alert and oriented to person, place, and time.  Psychiatric: She has a normal mood and affect.  Nursing note and vitals reviewed.  BP 134/79 (BP Location: Left Arm, Patient Position: Sitting, Cuff Size: Normal)   Pulse 72   Temp 98.4 F (36.9 C) (Oral)   Ht 5\' 7"  (1.702 m)   Wt 130 lb 12.8 oz (59.3 kg)   SpO2 98%   BMI 20.49 kg/m  Wt Readings from Last 3 Encounters:  04/28/18 130 lb 12.8 oz (59.3 kg)  12/10/17 129 lb 12.8 oz (58.9 kg)  10/11/16 131 lb 9.6 oz (59.7 kg)     Lab Results  Component Value Date   WBC 7.5 10/11/2016   HGB 13.3 10/11/2016   HCT 39.8 10/11/2016   PLT 286.0 10/11/2016   GLUCOSE 91 12/10/2017   CHOL 243 (H) 12/10/2017   TRIG 69.0 12/10/2017   HDL 83.50 12/10/2017   LDLDIRECT 164.1 08/28/2011   LDLCALC 146 (H) 12/10/2017   ALT 9 12/10/2017   AST 11 12/10/2017   NA 138 12/10/2017   K 4.0 12/10/2017   CL 104 12/10/2017   CREATININE 0.67 12/10/2017   BUN 18 12/10/2017   CO2  29 12/10/2017   TSH 0.92 08/28/2011    [place] Venous Img Lower Unilateral Left  Result Date: 09/17/2012 *RADIOLOGY REPORT* Clinical Data: Left knee pain and swelling after a long car trip LEFT LOWER EXTREMITY VENOUS DUPLEX ULTRASOUND Technique:  Gray-scale sonography with graded compression, as well as color Doppler and duplex ultrasound, were performed to evaluate the deep venous system of the lower extremity from the level of the common femoral vein through the popliteal and proximal calf veins. Spectral Doppler was utilized to  evaluate flow at rest and with distal augmentation maneuvers. Comparison:  None Findings: Deep venous system patent and compressible from left groin through popliteal fossa. Spontaneous venous flow present with intact augmentation and evidence of respiratory phasicity. No intraluminal thrombus identified. Visualized portion of the greater saphenous system unremarkable. Visualized portions of the deep venous system in the left calf also appear patent. IMPRESSION: No evidence of deep venous thrombosis in the left lower extremity. Original Report Authenticated By: Ulyses SouthwardMark Boles, M.D.     Assessment & Plan:  Plan  I am having Tonya MountLinda R. Bourque maintain her alendronate, meloxicam, and lisinopril.  No orders of the defined types were placed in this encounter.   Problem List Items Addressed This Visit      Unprioritized   Essential hypertension - Primary    Well controlled, no changes to meds. Encouraged heart healthy diet such as the DASH diet and exercise as tolerated.          Follow-up: Return in about 3 months (around 07/29/2018), or if symptoms worsen or fail to improve.  Donato SchultzYvonne R Lowne Chase, DO

## 2018-06-25 DIAGNOSIS — M8588 Other specified disorders of bone density and structure, other site: Secondary | ICD-10-CM | POA: Diagnosis not present

## 2018-06-25 DIAGNOSIS — N958 Other specified menopausal and perimenopausal disorders: Secondary | ICD-10-CM | POA: Diagnosis not present

## 2018-06-25 DIAGNOSIS — Z6820 Body mass index (BMI) 20.0-20.9, adult: Secondary | ICD-10-CM | POA: Diagnosis not present

## 2018-06-25 DIAGNOSIS — Z01419 Encounter for gynecological examination (general) (routine) without abnormal findings: Secondary | ICD-10-CM | POA: Diagnosis not present

## 2018-08-19 DIAGNOSIS — Z1231 Encounter for screening mammogram for malignant neoplasm of breast: Secondary | ICD-10-CM | POA: Diagnosis not present

## 2018-08-19 DIAGNOSIS — R69 Illness, unspecified: Secondary | ICD-10-CM | POA: Diagnosis not present

## 2018-08-19 LAB — HM MAMMOGRAPHY

## 2018-09-11 ENCOUNTER — Telehealth: Payer: Self-pay | Admitting: *Deleted

## 2018-09-11 NOTE — Telephone Encounter (Signed)
Received Mammogram results from [facility]; forwarded to provider/SLS 12/05  

## 2018-10-03 ENCOUNTER — Other Ambulatory Visit: Payer: Self-pay | Admitting: Family Medicine

## 2018-10-07 ENCOUNTER — Encounter: Payer: Self-pay | Admitting: *Deleted

## 2018-11-05 DIAGNOSIS — R69 Illness, unspecified: Secondary | ICD-10-CM | POA: Diagnosis not present

## 2019-01-08 ENCOUNTER — Encounter: Payer: Medicare HMO | Admitting: Family Medicine

## 2019-02-06 ENCOUNTER — Encounter: Payer: Medicare HMO | Admitting: Family Medicine

## 2019-04-08 ENCOUNTER — Other Ambulatory Visit: Payer: Self-pay | Admitting: Family Medicine

## 2019-05-02 ENCOUNTER — Other Ambulatory Visit: Payer: Self-pay | Admitting: Family Medicine

## 2019-05-27 ENCOUNTER — Other Ambulatory Visit: Payer: Self-pay | Admitting: Family Medicine

## 2019-05-27 NOTE — Telephone Encounter (Signed)
LM for pt to call and schedule OV w/in 30 days.

## 2019-06-16 ENCOUNTER — Other Ambulatory Visit: Payer: Self-pay

## 2019-06-17 ENCOUNTER — Ambulatory Visit: Payer: Medicare HMO | Admitting: Family Medicine

## 2019-06-17 DIAGNOSIS — R69 Illness, unspecified: Secondary | ICD-10-CM | POA: Diagnosis not present

## 2019-06-18 ENCOUNTER — Other Ambulatory Visit: Payer: Self-pay

## 2019-06-18 ENCOUNTER — Ambulatory Visit (INDEPENDENT_AMBULATORY_CARE_PROVIDER_SITE_OTHER): Payer: Medicare HMO | Admitting: Family Medicine

## 2019-06-18 ENCOUNTER — Encounter: Payer: Self-pay | Admitting: Family Medicine

## 2019-06-18 VITALS — BP 130/78 | HR 73 | Temp 97.2°F | Resp 18 | Ht 67.0 in | Wt 130.6 lb

## 2019-06-18 DIAGNOSIS — M25569 Pain in unspecified knee: Secondary | ICD-10-CM | POA: Diagnosis not present

## 2019-06-18 DIAGNOSIS — I10 Essential (primary) hypertension: Secondary | ICD-10-CM

## 2019-06-18 LAB — COMPREHENSIVE METABOLIC PANEL
ALT: 8 U/L (ref 0–35)
AST: 10 U/L (ref 0–37)
Albumin: 4.1 g/dL (ref 3.5–5.2)
Alkaline Phosphatase: 59 U/L (ref 39–117)
BUN: 13 mg/dL (ref 6–23)
CO2: 29 mEq/L (ref 19–32)
Calcium: 9.6 mg/dL (ref 8.4–10.5)
Chloride: 102 mEq/L (ref 96–112)
Creatinine, Ser: 0.67 mg/dL (ref 0.40–1.20)
GFR: 86.34 mL/min (ref >60.00)
Glucose, Bld: 88 mg/dL (ref 70–99)
Potassium: 4 mEq/L (ref 3.5–5.1)
Sodium: 138 mEq/L (ref 135–145)
Total Bilirubin: 0.4 mg/dL (ref 0.2–1.2)
Total Protein: 7.3 g/dL (ref 6.0–8.3)

## 2019-06-18 LAB — LIPID PANEL
Cholesterol: 229 mg/dL — ABNORMAL HIGH (ref 0–200)
HDL: 57.6 mg/dL (ref >39.00)
LDL Cholesterol: 155 mg/dL — ABNORMAL HIGH (ref 0–99)
NonHDL: 171.31
Total CHOL/HDL Ratio: 4
Triglycerides: 82 mg/dL (ref 0.0–149.0)
VLDL: 16.4 mg/dL (ref 0.0–40.0)

## 2019-06-18 MED ORDER — MELOXICAM 15 MG PO TABS: 15.0000 mg | ORAL_TABLET | Freq: Every day | ORAL | 1 refills | Status: DC

## 2019-06-18 MED ORDER — LISINOPRIL 10 MG PO TABS: 10.0000 mg | ORAL_TABLET | Freq: Every day | ORAL | 1 refills | Status: DC

## 2019-06-18 NOTE — Patient Instructions (Signed)

## 2019-06-18 NOTE — Assessment & Plan Note (Signed)
Controlled with mobic Refill med

## 2019-06-18 NOTE — Assessment & Plan Note (Signed)
Well controlled, no changes to meds. Encouraged heart healthy diet such as the DASH diet and exercise as tolerated.  °

## 2019-06-18 NOTE — Progress Notes (Signed)
Patient ID: Tonya Sheppard, female    DOB: Nov 08, 1945  Age: 73 y.o. MRN: [id number]    Subjective:  Subjective  HPI Tonya Sheppard presents for f/u bp and chol Pt with no complaints.    Review of Systems  Constitutional: Negative for appetite change, diaphoresis, fatigue and unexpected weight change.  Eyes: Negative for pain, redness and visual disturbance.  Respiratory: Negative for cough, chest tightness, shortness of breath and wheezing.   Cardiovascular: Negative for chest pain, palpitations and leg swelling.  Endocrine: Negative for cold intolerance, heat intolerance, polydipsia, polyphagia and polyuria.  Genitourinary: Negative for difficulty urinating, dysuria and frequency.  Neurological: Negative for dizziness, light-headedness, numbness and headaches.    History Past Medical History:  Diagnosis Date   Osteopenia     She has a past surgical history that includes Tubal ligation; Eye surgery; and Cataract extraction.   Her family history includes Alcohol abuse in her brother and sister; Cancer (age of onset: 57) in her sister; Heart disease in her mother; Heart disease (age of onset: 37) in her father; Hepatitis in her brother; Hypertension in her mother.She reports that she has never smoked. She has never used smokeless tobacco. She reports current alcohol use of about 7.0 - 14.0 standard drinks of alcohol per week. She reports that she does not use drugs.  Current Outpatient Medications on File Prior to Visit  Medication Sig Dispense Refill   alendronate (FOSAMAX) 70 MG tablet      No current facility-administered medications on file prior to visit.      Objective:  Objective  Physical Exam Vitals signs and nursing note reviewed.  Constitutional:      Appearance: She is well-developed.  HENT:     Head: Normocephalic and atraumatic.  Eyes:     Conjunctiva/sclera: Conjunctivae normal.  Neck:     Musculoskeletal: Normal range of motion and neck supple.   Thyroid: No thyromegaly.     Vascular: No carotid bruit or JVD.  Cardiovascular:     Rate and Rhythm: Normal rate and regular rhythm.     Heart sounds: Normal heart sounds. No murmur.  Pulmonary:     Effort: Pulmonary effort is normal. No respiratory distress.     Breath sounds: Normal breath sounds. No wheezing or rales.  Chest:     Chest wall: No tenderness.  Neurological:     Mental Status: She is alert and oriented to person, place, and time.    BP 130/78 (BP Location: Left Arm, Patient Position: Sitting, Cuff Size: Normal)    Pulse 73    Temp (!) 97.2 F (36.2 C) (Temporal)    Resp 18    Ht 5\' 7"  (1.702 m)    Wt 130 lb 9.6 oz (59.2 kg)    SpO2 98%    BMI 20.45 kg/m  Wt Readings from Last 3 Encounters:  06/18/19 130 lb 9.6 oz (59.2 kg)  04/28/18 130 lb 12.8 oz (59.3 kg)  12/10/17 129 lb 12.8 oz (58.9 kg)     Lab Results  Component Value Date   WBC 7.5 10/11/2016   HGB 13.3 10/11/2016   HCT 39.8 10/11/2016   PLT 286.0 10/11/2016   GLUCOSE 91 12/10/2017   CHOL 243 (H) 12/10/2017   TRIG 69.0 12/10/2017   HDL 83.50 12/10/2017   LDLDIRECT 164.1 08/28/2011   LDLCALC 146 (H) 12/10/2017   ALT 9 12/10/2017   AST 11 12/10/2017   NA 138 12/10/2017   K 4.0 12/10/2017   CL  104 12/10/2017   CREATININE 0.67 12/10/2017   BUN 18 12/10/2017   CO2 29 12/10/2017   TSH 0.92 08/28/2011    [place] Venous Img Lower Unilateral Left  Result Date: 09/17/2012 *RADIOLOGY REPORT* Clinical Data: Left knee pain and swelling after a long car trip LEFT LOWER EXTREMITY VENOUS DUPLEX ULTRASOUND Technique:  Gray-scale sonography with graded compression, as well as color Doppler and duplex ultrasound, were performed to evaluate the deep venous system of the lower extremity from the level of the common femoral vein through the popliteal and proximal calf veins. Spectral Doppler was utilized to evaluate flow at rest and with distal augmentation maneuvers. Comparison:  None Findings: Deep venous system  patent and compressible from left groin through popliteal fossa. Spontaneous venous flow present with intact augmentation and evidence of respiratory phasicity. No intraluminal thrombus identified. Visualized portion of the greater saphenous system unremarkable. Visualized portions of the deep venous system in the left calf also appear patent. IMPRESSION: No evidence of deep venous thrombosis in the left lower extremity. Original Report Authenticated By: Ulyses SouthwardMark Boles, M.D.     Assessment & Plan:  Plan  I have changed Tonya Sheppard's lisinopril. I am also having her maintain her alendronate and meloxicam.  Meds ordered this encounter  Medications   meloxicam (MOBIC) 15 MG tablet    Sig: Take 1 tablet (15 mg total) by mouth daily.    Dispense:  90 tablet    Refill:  1   lisinopril (ZESTRIL) 10 MG tablet    Sig: Take 1 tablet (10 mg total) by mouth daily.    Dispense:  90 tablet    Refill:  1    Problem List Items Addressed This Visit      Unprioritized   Essential hypertension - Primary    Well controlled, no changes to meds. Encouraged heart healthy diet such as the DASH diet and exercise as tolerated.        Relevant Medications   lisinopril (ZESTRIL) 10 MG tablet   Other Relevant Orders   Lipid panel   Comprehensive metabolic panel   Knee pain    Controlled with mobic Refill med      Relevant Medications   meloxicam (MOBIC) 15 MG tablet      Follow-up: Return in about 1 year (around 06/17/2020), or if symptoms worsen or fail to improve, for annual exam, fasting.  Donato SchultzYvonne R Lowne Chase, DO

## 2019-06-19 ENCOUNTER — Other Ambulatory Visit: Payer: Self-pay | Admitting: Family Medicine

## 2019-06-19 DIAGNOSIS — E785 Hyperlipidemia, unspecified: Secondary | ICD-10-CM

## 2019-06-23 ENCOUNTER — Other Ambulatory Visit: Payer: Self-pay | Admitting: Family Medicine

## 2019-06-23 DIAGNOSIS — I10 Essential (primary) hypertension: Secondary | ICD-10-CM

## 2019-07-27 DIAGNOSIS — R69 Illness, unspecified: Secondary | ICD-10-CM | POA: Diagnosis not present

## 2019-07-27 DIAGNOSIS — Z6821 Body mass index (BMI) 21.0-21.9, adult: Secondary | ICD-10-CM | POA: Diagnosis not present

## 2019-07-27 DIAGNOSIS — Z01419 Encounter for gynecological examination (general) (routine) without abnormal findings: Secondary | ICD-10-CM | POA: Diagnosis not present

## 2019-09-18 DIAGNOSIS — Z1231 Encounter for screening mammogram for malignant neoplasm of breast: Secondary | ICD-10-CM | POA: Diagnosis not present

## 2019-09-18 LAB — HM MAMMOGRAPHY

## 2019-09-25 LAB — HM MAMMOGRAPHY

## 2019-11-16 ENCOUNTER — Telehealth: Payer: Self-pay | Admitting: Family Medicine

## 2019-11-16 NOTE — Telephone Encounter (Signed)
Spoke with Tonya Sheppard regarding AWV. Patient stated she will give office a call back when she is ready to schedule. SF

## 2020-01-18 ENCOUNTER — Other Ambulatory Visit: Payer: Self-pay | Admitting: Family Medicine

## 2020-01-18 DIAGNOSIS — I10 Essential (primary) hypertension: Secondary | ICD-10-CM

## 2020-01-26 DIAGNOSIS — H524 Presbyopia: Secondary | ICD-10-CM | POA: Diagnosis not present

## 2020-01-26 DIAGNOSIS — H5213 Myopia, bilateral: Secondary | ICD-10-CM | POA: Diagnosis not present

## 2020-01-26 DIAGNOSIS — H26493 Other secondary cataract, bilateral: Secondary | ICD-10-CM | POA: Diagnosis not present

## 2020-01-26 DIAGNOSIS — H52223 Regular astigmatism, bilateral: Secondary | ICD-10-CM | POA: Diagnosis not present

## 2020-02-04 DIAGNOSIS — R69 Illness, unspecified: Secondary | ICD-10-CM | POA: Diagnosis not present

## 2020-02-17 DIAGNOSIS — H26493 Other secondary cataract, bilateral: Secondary | ICD-10-CM | POA: Diagnosis not present

## 2020-02-17 DIAGNOSIS — H43813 Vitreous degeneration, bilateral: Secondary | ICD-10-CM | POA: Diagnosis not present

## 2020-02-17 DIAGNOSIS — Z961 Presence of intraocular lens: Secondary | ICD-10-CM | POA: Diagnosis not present

## 2020-02-17 DIAGNOSIS — H17822 Peripheral opacity of cornea, left eye: Secondary | ICD-10-CM | POA: Diagnosis not present

## 2020-04-06 ENCOUNTER — Encounter: Payer: Self-pay | Admitting: Family Medicine

## 2020-04-08 ENCOUNTER — Telehealth (INDEPENDENT_AMBULATORY_CARE_PROVIDER_SITE_OTHER): Payer: Medicare HMO | Admitting: Family Medicine

## 2020-04-08 ENCOUNTER — Encounter: Payer: Self-pay | Admitting: Family Medicine

## 2020-04-08 DIAGNOSIS — B353 Tinea pedis: Secondary | ICD-10-CM | POA: Diagnosis not present

## 2020-04-08 MED ORDER — NAFTIFINE HCL 2 % EX CREA: TOPICAL_CREAM | CUTANEOUS | 1 refills | Status: DC

## 2020-04-08 NOTE — Progress Notes (Signed)
Virtual Visit via Video Note  I connected with Tonya Sheppard on 04/08/20 at  1:20 PM EDT by a video enabled telemedicine application and verified that I am speaking with the correct person using two identifiers.  Location: Patient: home alone  Provider: office    I discussed the limitations of evaluation and management by telemedicine and the availability of in person appointments. The patient expressed understanding and agreed to proceed.  History of Present Illness: Pt is home c/o several month hx itchy skin and cracked skin -- c/w athletes foot    Observations/Objective: Vitals with BMI 04/08/2020 06/18/2019 04/28/2018  Height - 5\' 7"  5\' 7"   Weight - 130 lbs 10 oz 130 lbs 13 oz  BMI - 20.45 20.48  Systolic (No Data) 130 134  Diastolic (No Data) 78 79  Pulse - 73 72  R foot--- skin on heel dry and cracked and itchy Assessment and Plan: 1. Tinea pedis of right foot Cream per orders Call if no improvement  - Naftifine HCl (NAFTIN) 2 % CREA; Apply qd  Dispense: 60 g; Refill: 1   Follow Up Instructions:    I discussed the assessment and treatment plan with the patient. The patient was provided an opportunity to ask questions and all were answered. The patient agreed with the plan and demonstrated an understanding of the instructions.   The patient was advised to call back or seek an in-person evaluation if the symptoms worsen or if the condition fails to improve as anticipated.  I provided 30 minutes of non-face-to-face time during this encounter.   , DO

## 2020-04-18 ENCOUNTER — Other Ambulatory Visit: Payer: Self-pay | Admitting: Family Medicine

## 2020-04-18 DIAGNOSIS — I10 Essential (primary) hypertension: Secondary | ICD-10-CM

## 2020-05-28 DIAGNOSIS — H52209 Unspecified astigmatism, unspecified eye: Secondary | ICD-10-CM | POA: Diagnosis not present

## 2020-05-28 DIAGNOSIS — H5213 Myopia, bilateral: Secondary | ICD-10-CM | POA: Diagnosis not present

## 2020-05-28 DIAGNOSIS — H524 Presbyopia: Secondary | ICD-10-CM | POA: Diagnosis not present

## 2020-07-28 DIAGNOSIS — Z01419 Encounter for gynecological examination (general) (routine) without abnormal findings: Secondary | ICD-10-CM | POA: Diagnosis not present

## 2020-07-28 DIAGNOSIS — Z6820 Body mass index (BMI) 20.0-20.9, adult: Secondary | ICD-10-CM | POA: Diagnosis not present

## 2020-07-28 DIAGNOSIS — M8588 Other specified disorders of bone density and structure, other site: Secondary | ICD-10-CM | POA: Diagnosis not present

## 2020-07-28 DIAGNOSIS — N958 Other specified menopausal and perimenopausal disorders: Secondary | ICD-10-CM | POA: Diagnosis not present

## 2020-08-18 DIAGNOSIS — R69 Illness, unspecified: Secondary | ICD-10-CM | POA: Diagnosis not present

## 2020-08-24 DIAGNOSIS — R69 Illness, unspecified: Secondary | ICD-10-CM | POA: Diagnosis not present

## 2020-09-12 DIAGNOSIS — B353 Tinea pedis: Secondary | ICD-10-CM | POA: Diagnosis not present

## 2020-10-22 ENCOUNTER — Other Ambulatory Visit: Payer: Self-pay | Admitting: Family Medicine

## 2020-10-22 DIAGNOSIS — I10 Essential (primary) hypertension: Secondary | ICD-10-CM

## 2020-10-26 DIAGNOSIS — Z1231 Encounter for screening mammogram for malignant neoplasm of breast: Secondary | ICD-10-CM | POA: Diagnosis not present

## 2020-11-07 DIAGNOSIS — L403 Pustulosis palmaris et plantaris: Secondary | ICD-10-CM | POA: Diagnosis not present

## 2020-11-16 ENCOUNTER — Other Ambulatory Visit: Payer: Self-pay

## 2020-11-17 ENCOUNTER — Ambulatory Visit (INDEPENDENT_AMBULATORY_CARE_PROVIDER_SITE_OTHER): Payer: Medicare HMO | Admitting: Family Medicine

## 2020-11-17 ENCOUNTER — Encounter: Payer: Self-pay | Admitting: Family Medicine

## 2020-11-17 VITALS — BP 120/86 | HR 74 | Temp 98.7°F | Resp 18 | Ht 67.0 in | Wt 135.0 lb

## 2020-11-17 DIAGNOSIS — I10 Essential (primary) hypertension: Secondary | ICD-10-CM | POA: Diagnosis not present

## 2020-11-17 DIAGNOSIS — M25569 Pain in unspecified knee: Secondary | ICD-10-CM

## 2020-11-17 DIAGNOSIS — Z0000 Encounter for general adult medical examination without abnormal findings: Secondary | ICD-10-CM | POA: Diagnosis not present

## 2020-11-17 LAB — COMPREHENSIVE METABOLIC PANEL
ALT: 12 U/L (ref 0–35)
AST: 14 U/L (ref 0–37)
Albumin: 4.4 g/dL (ref 3.5–5.2)
Alkaline Phosphatase: 48 U/L (ref 39–117)
BUN: 20 mg/dL (ref 6–23)
CO2: 28 mEq/L (ref 19–32)
Calcium: 9.8 mg/dL (ref 8.4–10.5)
Chloride: 103 mEq/L (ref 96–112)
Creatinine, Ser: 0.75 mg/dL (ref 0.40–1.20)
GFR: 78.58 mL/min (ref >60.00)
Glucose, Bld: 95 mg/dL (ref 70–99)
Potassium: 4.7 mEq/L (ref 3.5–5.1)
Sodium: 139 mEq/L (ref 135–145)
Total Bilirubin: 0.6 mg/dL (ref 0.2–1.2)
Total Protein: 7.2 g/dL (ref 6.0–8.3)

## 2020-11-17 LAB — CBC WITH DIFFERENTIAL/PLATELET
Basophils Absolute: 0.1 10*3/uL (ref 0.0–0.1)
Basophils Relative: 0.9 % (ref 0.0–3.0)
Eosinophils Absolute: 0.2 10*3/uL (ref 0.0–0.7)
Eosinophils Relative: 2.3 % (ref 0.0–5.0)
HCT: 41.3 % (ref 36.0–46.0)
Hemoglobin: 13.8 g/dL (ref 12.0–15.0)
Lymphocytes Relative: 37.9 % (ref 12.0–46.0)
Lymphs Abs: 2.7 10*3/uL (ref 0.7–4.0)
MCHC: 33.5 g/dL (ref 30.0–36.0)
MCV: 92.7 fl (ref 78.0–100.0)
Monocytes Absolute: 0.7 10*3/uL (ref 0.1–1.0)
Monocytes Relative: 9.6 % (ref 3.0–12.0)
Neutro Abs: 3.5 10*3/uL (ref 1.4–7.7)
Neutrophils Relative %: 49.3 % (ref 43.0–77.0)
Platelets: 315 10*3/uL (ref 150.0–400.0)
RBC: 4.45 Mil/uL (ref 3.87–5.11)
RDW: 13.6 % (ref 11.5–15.5)
WBC: 7 10*3/uL (ref 4.0–10.5)

## 2020-11-17 LAB — LIPID PANEL
Cholesterol: 274 mg/dL — ABNORMAL HIGH (ref 0–200)
HDL: 81.6 mg/dL (ref >39.00)
LDL Cholesterol: 180 mg/dL — ABNORMAL HIGH (ref 0–99)
NonHDL: 192.76
Total CHOL/HDL Ratio: 3
Triglycerides: 63 mg/dL (ref 0.0–149.0)
VLDL: 12.6 mg/dL (ref 0.0–40.0)

## 2020-11-17 LAB — MICROALBUMIN / CREATININE URINE RATIO
Creatinine,U: 41.1 mg/dL
Microalb Creat Ratio: 1.7 mg/g (ref 0.0–30.0)
Microalb, Ur: <0.7 mg/dL (ref 0.0–1.9)

## 2020-11-17 MED ORDER — LISINOPRIL 10 MG PO TABS: 10.0000 mg | ORAL_TABLET | Freq: Every day | ORAL | 1 refills | Status: DC

## 2020-11-17 MED ORDER — MELOXICAM 15 MG PO TABS: 15.0000 mg | ORAL_TABLET | Freq: Every day | ORAL | 1 refills | Status: DC

## 2020-11-17 NOTE — Patient Instructions (Signed)
Preventive Care 75 Years and Older, Female Preventive care refers to lifestyle choices and visits with your health care provider that can promote health and wellness. This includes:  A yearly physical exam. This is also called an annual wellness visit.  Regular dental and eye exams.  Immunizations.  Screening for certain conditions.  Healthy lifestyle choices, such as: ? Eating a healthy diet. ? Getting regular exercise. ? Not using drugs or products that contain nicotine and tobacco. ? Limiting alcohol use. What can I expect for my preventive care visit? Physical exam Your health care provider will check your:  Height and weight. These may be used to calculate your BMI (body mass index). BMI is a measurement that tells if you are at a healthy weight.  Heart rate and blood pressure.  Body temperature.  Skin for abnormal spots. Counseling Your health care provider may ask you questions about your:  Past medical problems.  Family's medical history.  Alcohol, tobacco, and drug use.  Emotional well-being.  Home life and relationship well-being.  Sexual activity.  Diet, exercise, and sleep habits.  History of falls.  Memory and ability to understand (cognition).  Work and work Statistician.  Pregnancy and menstrual history.  Access to firearms. What immunizations do I need? Vaccines are usually given at various ages, according to a schedule. Your health care provider will recommend vaccines for you based on your age, medical history, and lifestyle or other factors, such as travel or where you work.   What tests do I need? Blood tests  Lipid and cholesterol levels. These may be checked every 5 years, or more often depending on your overall health.  Hepatitis C test.  Hepatitis B test. Screening  Lung cancer screening. You may have this screening every year starting at age 75 if you have a 30-pack-year history of smoking and currently smoke or have quit within  the past 15 years.  Colorectal cancer screening. ? All adults should have this screening starting at age 44 and continuing until age 58. ? Your health care provider may recommend screening at age 2 if you are at increased risk. ? You will have tests every 1-10 years, depending on your results and the type of screening test.  Diabetes screening. ? This is done by checking your blood sugar (glucose) after you have not eaten for a while (fasting). ? You may have this done every 1-3 years.  Mammogram. ? This may be done every 1-2 years. ? Talk with your health care provider about how often you should have regular mammograms.  Abdominal aortic aneurysm (AAA) screening. You may need this if you are a current or former smoker.  BRCA-related cancer screening. This may be done if you have a family history of breast, ovarian, tubal, or peritoneal cancers. Other tests  STD (sexually transmitted disease) testing, if you are at risk.  Bone density scan. This is done to screen for osteoporosis. You may have this done starting at age 75. Talk with your health care provider about your test results, treatment options, and if necessary, the need for more tests. Follow these instructions at home: Eating and drinking  Eat a diet that includes fresh fruits and vegetables, whole grains, lean protein, and low-fat dairy products. Limit your intake of foods with high amounts of sugar, saturated fats, and salt.  Take vitamin and mineral supplements as recommended by your health care provider.  Do not drink alcohol if your health care provider tells you not to drink.  If you drink alcohol: ? Limit how much you have to 0-1 drink a day. ? Be aware of how much alcohol is in your drink. In the [place] one drink equals one 12 oz bottle of beer (355 mL), one 5 oz glass of wine (148 mL), or one 1 oz glass of hard liquor (44 mL).   Lifestyle  Take daily care of your teeth and gums. Brush your teeth every morning  and night with fluoride toothpaste. Floss one time each day.  Stay active. Exercise for at least 30 minutes 5 or more days each week.  Do not use any products that contain nicotine or tobacco, such as cigarettes, e-cigarettes, and chewing tobacco. If you need help quitting, ask your health care provider.  Do not use drugs.  If you are sexually active, practice safe sex. Use a condom or other form of protection in order to prevent STIs (sexually transmitted infections).  Talk with your health care provider about taking a low-dose aspirin or statin.  Find healthy ways to cope with stress, such as: ? Meditation, yoga, or listening to music. ? Journaling. ? Talking to a trusted person. ? Spending time with friends and family. Safety  Always wear your seat belt while driving or riding in a vehicle.  Do not drive: ? If you have been drinking alcohol. Do not ride with someone who has been drinking. ? When you are tired or distracted. ? While texting.  Wear a helmet and other protective equipment during sports activities.  If you have firearms in your house, make sure you follow all gun safety procedures. What's next?  Visit your health care provider once a year for an annual wellness visit.  Ask your health care provider how often you should have your eyes and teeth checked.  Stay up to date on all vaccines. This information is not intended to replace advice given to you by your health care provider. Make sure you discuss any questions you have with your health care provider. Document Revised: 09/14/2020 Document Reviewed: 09/18/2018 Elsevier Patient Education  2021 Elsevier Inc.  

## 2020-11-17 NOTE — Progress Notes (Signed)
Subjective:     Tonya Sheppard is a 75 y.o. female and is here for a comprehensive physical exam. The patient reports no problems.   She does need a refill on the bp med and mobic for knee pain  Social History   Socioeconomic History  . Marital status: Married    Spouse name: Not on file  . Number of children: Not on file  . Years of education: Not on file  . Highest education level: Not on file  Occupational History  . Occupation: financial analyst--retired    Employer: [employer]  Tobacco Use  . Smoking status: Never Smoker  . Smokeless tobacco: Never Used  Substance and Sexual Activity  . Alcohol use: Yes    Alcohol/week: 7.0 - 14.0 standard drinks    Types: 7 - 14 Glasses of wine per week  . Drug use: No  . Sexual activity: Yes    Partners: Male  Other Topics Concern  . Not on file  Social History Narrative   Exercise- [employer], yard work   Social Determinants of Corporate investment banker Strain: Not on [employer] Insecurity: Not on file  Transportation Needs: Not on file  Physical Activity: Not on file  Stress: Not on file  Social Connections: Not on file  Intimate Partner Violence: Not on file   Health Maintenance  Topic Date Due  . MAMMOGRAM  09/24/2020  . Hepatitis C Screening  10/11/2021 (Originally 08/08/1946)  . TETANUS/TDAP  08/27/2021  . COLONOSCOPY (Pts 45-1yrs Insurance coverage will need to be confirmed)  10/23/2025  . INFLUENZA VACCINE  Completed  . DEXA SCAN  Completed  . COVID-19 Vaccine  Completed  . PNA vac Low Risk Adult  Completed    The following portions of the patient's history were reviewed and updated as appropriate:  She  has a past medical history of Osteopenia. She does not have any pertinent problems on file. She  has a past surgical history that includes Tubal ligation; Eye surgery; and Cataract extraction. Her family history includes Alcohol abuse in her brother and sister; Cancer (age of onset: 46) in her sister;  Esophageal cancer in her sister; Heart disease in her mother; Heart disease (age of onset: 58) in her father; Hepatitis in her brother; Hypertension in her mother. She  reports that she has never smoked. She has never used smokeless tobacco. She reports current alcohol use of about 7.0 - 14.0 standard drinks of alcohol per week. She reports that she does not use drugs. She has a current medication list which includes the following prescription(s): meloxicam, naftifine hcl, and lisinopril. Current Outpatient Medications on File Prior to Visit  Medication Sig Dispense Refill  . meloxicam (MOBIC) 15 MG tablet Take 1 tablet (15 mg total) by mouth daily. 90 tablet 1  . Naftifine HCl (NAFTIN) 2 % CREA Apply qd 60 g 1   No current facility-administered medications on file prior to visit.   She has No Known Allergies..  Review of Systems Review of Systems  Constitutional: Negative for activity change, appetite change and fatigue.  HENT: Negative for hearing loss, congestion, tinnitus and ear discharge.  dentist q60m Eyes: Negative for visual disturbance (see optho q1y -- vision corrected to 20/20 with glasses).  Respiratory: Negative for cough, chest tightness and shortness of breath.   Cardiovascular: Negative for chest pain, palpitations and leg swelling.  Gastrointestinal: Negative for abdominal pain, diarrhea, constipation and abdominal distention.  Genitourinary: Negative for urgency, frequency, decreased  urine volume and difficulty urinating.  Musculoskeletal: Negative for back pain, arthralgias and gait problem.  Skin: Negative for color change, pallor and rash.  Neurological: Negative for dizziness, light-headedness, numbness and headaches.  Hematological: Negative for adenopathy. Does not bruise/bleed easily.  Psychiatric/Behavioral: Negative for suicidal ideas, confusion, sleep disturbance, self-injury, dysphoric mood, decreased concentration and agitation.       Objective:    BP  120/86 (BP Location: Right Arm, Patient Position: Sitting, Cuff Size: Normal)   Pulse 74   Temp 98.7 F (37.1 C) (Oral)   Resp 18   Ht 5\' 7"  (1.702 m)   Wt 135 lb (61.2 kg)   SpO2 98%   BMI 21.14 kg/m  General appearance: alert, cooperative, appears stated age and no distress Head: Normocephalic, without obvious abnormality, atraumatic Eyes: negative findings: lids and lashes normal, conjunctivae and sclerae normal and pupils equal, round, reactive to light and accomodation Ears: normal TM's and external ear canals both ears Neck: no adenopathy, no carotid bruit, no JVD, supple, symmetrical, trachea midline and thyroid not enlarged, symmetric, no tenderness/mass/nodules Back: symmetric, no curvature. ROM normal. No CVA tenderness. Lungs: clear to auscultation bilaterally Breasts: normal Heart: regular rate and rhythm, S1, S2 normal, no murmur, click, rub or gallop Abdomen: soft, non-tender; bowel sounds normal; no masses,  no organomegaly Pelvic: deferred --gyn Extremities: extremities normal, atraumatic, no cyanosis or edema Pulses: 2+ and symmetric Skin: Skin color, texture, turgor normal. No rashes or lesions Lymph nodes: Cervical, supraclavicular, and axillary nodes normal. Neurologic: Alert and oriented X 3, normal strength and tone. Normal symmetric reflexes. Normal coordination and gait    Assessment:    Healthy female exam.      Plan:    ghm utd Check labs  See After Visit Summary for Counseling Recommendations     1. Essential hypertension Well controlled, no changes to meds. Encouraged heart healthy diet such as the DASH diet and exercise as tolerated.  - lisinopril (ZESTRIL) 10 MG tablet; Take 1 tablet (10 mg total) by mouth daily.  Dispense: 90 tablet; Refill: 1 - Lipid panel - Comprehensive metabolic panel - CBC with Differential/Platelet - Microalbumin / creatinine urine ratio  2. Knee pain Stable  - meloxicam (MOBIC) 15 MG tablet; Take 1 tablet (15 mg  total) by mouth daily.  Dispense: 90 tablet; Refill: 1  3. Preventative health care See above

## 2021-04-17 DIAGNOSIS — L309 Dermatitis, unspecified: Secondary | ICD-10-CM | POA: Diagnosis not present

## 2021-05-23 ENCOUNTER — Other Ambulatory Visit: Payer: Self-pay | Admitting: Family Medicine

## 2021-05-23 DIAGNOSIS — H43813 Vitreous degeneration, bilateral: Secondary | ICD-10-CM | POA: Diagnosis not present

## 2021-05-23 DIAGNOSIS — Z961 Presence of intraocular lens: Secondary | ICD-10-CM | POA: Diagnosis not present

## 2021-05-23 DIAGNOSIS — H26491 Other secondary cataract, right eye: Secondary | ICD-10-CM | POA: Diagnosis not present

## 2021-05-23 DIAGNOSIS — I10 Essential (primary) hypertension: Secondary | ICD-10-CM

## 2021-05-29 ENCOUNTER — Ambulatory Visit (INDEPENDENT_AMBULATORY_CARE_PROVIDER_SITE_OTHER): Payer: Medicare HMO | Admitting: Family Medicine

## 2021-05-29 ENCOUNTER — Encounter: Payer: Self-pay | Admitting: Family Medicine

## 2021-05-29 ENCOUNTER — Other Ambulatory Visit: Payer: Self-pay

## 2021-05-29 VITALS — BP 122/84 | HR 64 | Temp 98.3°F | Resp 18 | Ht 67.0 in | Wt 135.2 lb

## 2021-05-29 DIAGNOSIS — E785 Hyperlipidemia, unspecified: Secondary | ICD-10-CM | POA: Insufficient documentation

## 2021-05-29 DIAGNOSIS — L659 Nonscarring hair loss, unspecified: Secondary | ICD-10-CM | POA: Diagnosis not present

## 2021-05-29 DIAGNOSIS — I10 Essential (primary) hypertension: Secondary | ICD-10-CM

## 2021-05-29 LAB — LIPID PANEL
Cholesterol: 266 mg/dL — ABNORMAL HIGH (ref 0–200)
HDL: 73 mg/dL (ref >39.00)
LDL Cholesterol: 178 mg/dL — ABNORMAL HIGH (ref 0–99)
NonHDL: 193.12
Total CHOL/HDL Ratio: 4
Triglycerides: 77 mg/dL (ref 0.0–149.0)
VLDL: 15.4 mg/dL (ref 0.0–40.0)

## 2021-05-29 LAB — COMPREHENSIVE METABOLIC PANEL
ALT: 11 U/L (ref 0–35)
AST: 14 U/L (ref 0–37)
Albumin: 4.2 g/dL (ref 3.5–5.2)
Alkaline Phosphatase: 54 U/L (ref 39–117)
BUN: 18 mg/dL (ref 6–23)
CO2: 27 mEq/L (ref 19–32)
Calcium: 9.8 mg/dL (ref 8.4–10.5)
Chloride: 101 mEq/L (ref 96–112)
Creatinine, Ser: 0.69 mg/dL (ref 0.40–1.20)
GFR: 85.34 mL/min (ref >60.00)
Glucose, Bld: 84 mg/dL (ref 70–99)
Potassium: 4.3 mEq/L (ref 3.5–5.1)
Sodium: 137 mEq/L (ref 135–145)
Total Bilirubin: 0.6 mg/dL (ref 0.2–1.2)
Total Protein: 7.4 g/dL (ref 6.0–8.3)

## 2021-05-29 NOTE — Assessment & Plan Note (Signed)
Check labs  Make sure there is enough protein in diet Try biotin/ b vitamins

## 2021-05-29 NOTE — Progress Notes (Signed)
Established Patient Office Visit  Subjective:  Patient ID: Tonya Sheppard, female    DOB: Aug 22, 1946  Age: 75 y.o. MRN: [id number]  CC:  Chief Complaint  Patient presents with   Hypertension   Follow-up    HPI Tonya Sheppard presents for f/u bp   no complaints   Past Medical History:  Diagnosis Date   Osteopenia     Past Surgical History:  Procedure Laterality Date   CATARACT EXTRACTION     EYE SURGERY     cateract right eye   TUBAL LIGATION      Family History  Problem Relation Age of Onset   Hypertension Mother    Heart disease Mother    Heart disease Father 84       MI   Cancer Sister 22       ovarian   Hepatitis Brother    Alcohol abuse Brother    Alcohol abuse Sister    Esophageal cancer Sister    Colon cancer Neg Hx     Social History   Socioeconomic History   Marital status: Married    Spouse name: Not on file   Number of children: Not on file   Years of education: Not on file   Highest education level: Not on file  Occupational History   Occupation: financial analyst--retired    Employer: [employer]  Tobacco Use   Smoking status: Never   Smokeless tobacco: Never  Substance and Sexual Activity   Alcohol use: Yes    Alcohol/week: 7.0 - 14.0 standard drinks    Types: 7 - 14 Glasses of wine per week   Drug use: No   Sexual activity: Yes    Partners: Male  Other Topics Concern   Not on file  Social History Narrative   Exercise- [employer], yard work   International aid/development worker of Corporate investment banker Strain: Not on file  Food Insecurity: Not on file  Transportation Needs: Not on file  Physical Activity: Not on file  Stress: Not on file  Social Connections: Not on file  Intimate Partner Violence: Not on file    Outpatient Medications Prior to Visit  Medication Sig Dispense Refill   lisinopril (ZESTRIL) 10 MG tablet TAKE ONE TABLET BY MOUTH DAILY  **PER MD: NEED APPOINTMENT FOR FURTHER REFILLS** 90 tablet 1   meloxicam  (MOBIC) 15 MG tablet Take 1 tablet (15 mg total) by mouth daily. 90 tablet 1   Naftifine HCl (NAFTIN) 2 % CREA Apply qd 60 g 1   No facility-administered medications prior to visit.    No Known Allergies  ROS Review of Systems  Constitutional:  Negative for appetite change, diaphoresis, fatigue and unexpected weight change.  Eyes:  Negative for pain, redness and visual disturbance.  Respiratory:  Negative for cough, chest tightness, shortness of breath and wheezing.   Cardiovascular:  Negative for chest pain, palpitations and leg swelling.  Endocrine: Negative for cold intolerance, heat intolerance, polydipsia, polyphagia and polyuria.  Genitourinary:  Negative for difficulty urinating, dysuria and frequency.  Neurological:  Negative for dizziness, light-headedness, numbness and headaches.     Objective:    Physical Exam Vitals and nursing note reviewed.  Constitutional:      Appearance: She is well-developed.  HENT:     Head: Normocephalic and atraumatic.  Eyes:     Conjunctiva/sclera: Conjunctivae normal.  Neck:     Thyroid: No thyromegaly.     Vascular: No carotid bruit or JVD.  Cardiovascular:     Rate and Rhythm: Normal rate and regular rhythm.     Heart sounds: Normal heart sounds. No murmur heard. Pulmonary:     Effort: Pulmonary effort is normal. No respiratory distress.     Breath sounds: Normal breath sounds. No wheezing or rales.  Chest:     Chest wall: No tenderness.  Musculoskeletal:     Cervical back: Normal range of motion and neck supple.  Neurological:     Mental Status: She is alert and oriented to person, place, and time.  Psychiatric:        Mood and Affect: Mood normal.        Behavior: Behavior normal.        Thought Content: Thought content normal.        Judgment: Judgment normal.    BP 122/84 (BP Location: Left Arm, Patient Position: Sitting, Cuff Size: Normal)   Pulse 64   Temp 98.3 F (36.8 C) (Oral)   Resp 18   Ht 5\' 7"  (1.702 m)    Wt 135 lb 3.2 oz (61.3 kg)   SpO2 96%   BMI 21.18 kg/m  Wt Readings from Last 3 Encounters:  05/29/21 135 lb 3.2 oz (61.3 kg)  11/17/20 135 lb (61.2 kg)  06/18/19 130 lb 9.6 oz (59.2 kg)     Health Maintenance Due  Topic Date Due   Zoster Vaccines- Shingrix (1 of 2) Never done   MAMMOGRAM  09/24/2020   INFLUENZA VACCINE  05/08/2021    There are no preventive care reminders to display for this patient.  Lab Results  Component Value Date   TSH 0.92 08/28/2011   Lab Results  Component Value Date   WBC 7.0 11/17/2020   HGB 13.8 11/17/2020   HCT 41.3 11/17/2020   MCV 92.7 11/17/2020   PLT 315.0 11/17/2020   Lab Results  Component Value Date   NA 139 11/17/2020   K 4.7 11/17/2020   CO2 28 11/17/2020   GLUCOSE 95 11/17/2020   BUN 20 11/17/2020   CREATININE 0.75 11/17/2020   BILITOT 0.6 11/17/2020   ALKPHOS 48 11/17/2020   AST 14 11/17/2020   ALT 12 11/17/2020   PROT 7.2 11/17/2020   ALBUMIN 4.4 11/17/2020   CALCIUM 9.8 11/17/2020   GFR 78.58 11/17/2020   Lab Results  Component Value Date   CHOL 274 (H) 11/17/2020   Lab Results  Component Value Date   HDL 81.60 11/17/2020   Lab Results  Component Value Date   LDLCALC 180 (H) 11/17/2020   Lab Results  Component Value Date   TRIG 63.0 11/17/2020   Lab Results  Component Value Date   CHOLHDL 3 11/17/2020   No results found for: HGBA1C    Assessment & Plan:   Problem List Items Addressed This Visit       Unprioritized   Essential hypertension    Well controlled, no changes to meds. Encouraged heart healthy diet such as the DASH diet and exercise as tolerated.       Hair loss    Check labs  Make sure there is enough protein in diet Try biotin/ b vitamins       Relevant Orders   Thyroid Panel With TSH   Hyperlipidemia - Primary    Encourage heart healthy diet such as MIND or DASH diet, increase exercise, avoid trans fats, simple carbohydrates and processed foods, consider a krill or fish or  flaxseed oil cap daily.  Relevant Orders   Lipid panel   Comprehensive metabolic panel    No orders of the defined types were placed in this encounter.   Follow-up: Return in about 6 months (around 11/29/2021), or if symptoms worsen or fail to improve, for annual exam, fasting.    Donato Schultz, DO

## 2021-05-29 NOTE — Assessment & Plan Note (Signed)
Encourage heart healthy diet such as MIND or DASH diet, increase exercise, avoid trans fats, simple carbohydrates and processed foods, consider a krill or fish or flaxseed oil cap daily.  °

## 2021-05-29 NOTE — Patient Instructions (Signed)

## 2021-05-29 NOTE — Assessment & Plan Note (Signed)
Well controlled, no changes to meds. Encouraged heart healthy diet such as the DASH diet and exercise as tolerated.  °

## 2021-05-30 LAB — THYROID PANEL WITH TSH
Free Thyroxine Index: 2.6 (ref 1.4–3.8)
T3 Uptake: 30 % (ref 22–35)
T4, Total: 8.6 ug/dL (ref 5.1–11.9)
TSH: 2.45 mIU/L (ref 0.40–4.50)

## 2021-06-05 ENCOUNTER — Other Ambulatory Visit: Payer: Self-pay | Admitting: Family Medicine

## 2021-06-05 DIAGNOSIS — E785 Hyperlipidemia, unspecified: Secondary | ICD-10-CM

## 2021-06-20 ENCOUNTER — Telehealth: Payer: Self-pay | Admitting: Family Medicine

## 2021-06-20 NOTE — Telephone Encounter (Signed)
Left message for patient to call back and schedule Medicare Annual Wellness Visit (AWV) in office.   If not able to come in office, please offer to do virtually or by telephone.  Left office number and my jabber #[phone].  Last AWV:10/11/2016  Please schedule at anytime with Nurse Health Advisor.   

## 2021-06-21 ENCOUNTER — Ambulatory Visit (INDEPENDENT_AMBULATORY_CARE_PROVIDER_SITE_OTHER): Payer: Medicare HMO

## 2021-06-21 VITALS — Ht 67.0 in | Wt 135.0 lb

## 2021-06-21 DIAGNOSIS — Z0000 Encounter for general adult medical examination without abnormal findings: Secondary | ICD-10-CM

## 2021-06-21 NOTE — Progress Notes (Addendum)
Subjective:   Tonya Sheppard is a 75 y.o. female who presents for Medicare Annual (Subsequent) preventive examination.  I connected with Jakhia today by telephone and verified that I am speaking with the correct person using two identifiers. Location patient: home Location provider: work Persons participating in the virtual visit: patient, Engineer, civil (consulting).    I discussed the limitations, risks, security and privacy concerns of performing an evaluation and management service by telephone and the availability of in person appointments. I also discussed with the patient that there may be a patient responsible charge related to this service. The patient expressed understanding and verbally consented to this telephonic visit.    Interactive audio and video telecommunications were attempted between this provider and patient, however failed, due to patient having technical difficulties OR patient did not have access to video capability.  We continued and completed visit with audio only.  Some vital signs may be absent or patient reported.   Time Spent with patient on telephone encounter: 20 minutes   Review of Systems     Cardiac Risk Factors include: advanced age (>58men, >11 women);dyslipidemia;hypertension     Objective:    Today's Vitals   06/21/21 0940  Weight: 135 lb (61.2 kg)  Height: 5\' 7"  (1.702 m)   Body mass index is 21.14 kg/m.  Advanced Directives 06/21/2021 10/11/2016 10/24/2015 10/11/2015  Does Patient Have a Medical Advance Directive? Yes Yes Yes Yes  Type of 12/09/2015 of [place];Living will Living will - Healthcare Power of [place];Living will  Does patient want to make changes to medical advance directive? - - - No - Patient declined  Copy of Healthcare Power of Attorney in Chart? No - copy requested - No - copy requested No - copy requested    Current Medications (verified) Outpatient Encounter Medications as of 06/21/2021  Medication Sig    lisinopril (ZESTRIL) 10 MG tablet TAKE ONE TABLET BY MOUTH DAILY  **PER MD: NEED APPOINTMENT FOR FURTHER REFILLS**   meloxicam (MOBIC) 15 MG tablet Take 1 tablet (15 mg total) by mouth daily.   No facility-administered encounter medications on file as of 06/21/2021.    Allergies (verified) Patient has no known allergies.   History: Past Medical History:  Diagnosis Date   Osteopenia    Past Surgical History:  Procedure Laterality Date   CATARACT EXTRACTION     EYE SURGERY     cateract right eye   TUBAL LIGATION     Family History  Problem Relation Age of Onset   Hypertension Mother    Heart disease Mother    Heart disease Father 65       MI   Cancer Sister 75       ovarian   Hepatitis Brother    Alcohol abuse Brother    Alcohol abuse Sister    Esophageal cancer Sister    Colon cancer Neg Hx    Social History   Socioeconomic History   Marital status: Married    Spouse name: Not on file   Number of children: Not on file   Years of education: Not on file   Highest education level: Not on file  Occupational History   Occupation: financial analyst--retired    Employer: [employer]  Tobacco Use   Smoking status: Never   Smokeless tobacco: Never  Substance and Sexual Activity   Alcohol use: Yes    Alcohol/week: 7.0 - 14.0 standard drinks    Types: 7 - 14 Glasses of wine  per week   Drug use: No   Sexual activity: Yes    Partners: Male  Other Topics Concern   Not on file  Social History Narrative   Exercise- [employer], yard work   International aid/development worker of Corporate investment banker Strain: Low Risk    Difficulty of Paying Living Expenses: Not hard at all  Food Insecurity: No Food Insecurity   Worried About Programme researcher, broadcasting/film/video in the Last Year: Never true   Barista in the Last Year: Never true  Transportation Needs: No Transportation Needs   Lack of Transportation (Medical): No   Lack of Transportation (Non-Medical): No  Physical Activity:  Insufficiently Active   Days of Exercise per Week: 3 days   Minutes of Exercise per Session: 30 min  Stress: No Stress Concern Present   Feeling of Stress : Not at all  Social Connections: Moderately Integrated   Frequency of Communication with Friends and Family: Once a week   Frequency of Social Gatherings with Friends and Family: Once a week   Attends Religious Services: 1 to 4 times per year   Active Member of [employer] or Organizations: Yes   Attends Banker Meetings: 1 to 4 times per year   Marital Status: Married    Tobacco Counseling Counseling given: Not Answered   Clinical Intake:  Pre-visit preparation completed: Yes  Pain : No/denies pain     BMI - recorded: 21.14 Nutritional Status: BMI of 19-24  Normal Nutritional Risks: None Diabetes: No  How often do you need to have someone help you when you read instructions, pamphlets, or other written materials from your doctor or pharmacy?: 1 - Never  Diabetic?No  Interpreter Needed?: No  Information entered by :: Thomasenia Sales LPN   Activities of Daily Living In your present state of health, do you have any difficulty performing the following activities: 06/21/2021 11/17/2020  Hearing? N N  Vision? N N  Difficulty concentrating or making decisions? N N  Walking or climbing stairs? N N  Dressing or bathing? N N  Doing errands, shopping? N N  Preparing Food and eating ? N -  Using the Toilet? N -  In the past six months, have you accidently leaked urine? N -  Do you have problems with loss of bowel control? N -  Managing your Medications? N -  Managing your Finances? N -  Housekeeping or managing your Housekeeping? N -  Some recent data might be hidden    Patient Care Team: Zola Button, Grayling Congress, DO as PCP - General Doristine Section, MD as Consulting Physician (Orthopedic Surgery) Richarda Overlie, MD as Consulting Physician (Obstetrics and Gynecology) Nelson Chimes, MD as Consulting Physician  (Ophthalmology)  Indicate any recent Medical Services you may have received from other than [facility] providers in the past year (date may be approximate).     Assessment:   This is a routine wellness examination for [place].  Hearing/Vision screen Hearing Screening - Comments:: No issues Vision Screening - Comments:: Last eye exam-05/2021-Dr. Jimmey Ralph  Dietary issues and exercise activities discussed: Current Exercise Habits: Home exercise routine, Type of exercise: walking, Time (Minutes): 30, Frequency (Times/Week): 3, Weekly Exercise (Minutes/Week): 90, Intensity: Mild, Exercise limited by: None identified   Goals Addressed             This Visit's Progress    Healthy Lifestyle   On track    Continue to eat heart healthy diet (full of fruits, vegetables, whole  grains, lean protein, water--limit salt, fat, and sugar intake) and increase physical activity as tolerated. Continue doing brain stimulating activities (puzzles, reading, adult coloring books, staying active) to keep memory sharp.        Depression Screen PHQ 2/9 Scores 06/21/2021 11/17/2020 12/10/2017 10/11/2016 12/24/2013  PHQ - 2 Score 0 0 1 0 0  PHQ- 9 Score - - 1 - -    Fall Risk Fall Risk  06/21/2021 11/17/2020 12/10/2017 10/11/2016 12/24/2013  Falls in the past year? 0 0 No No No  Number falls in past yr: 0 0 - - -  Injury with Fall? 0 0 - - -  Follow up Falls prevention discussed Falls evaluation completed - - -    FALL RISK PREVENTION PERTAINING TO THE HOME:  Any stairs in or around the home? Yes  If so, are there any without handrails? No  Home free of loose throw rugs in walkways, pet beds, electrical cords, etc? Yes  Adequate lighting in your home to reduce risk of falls? Yes   ASSISTIVE DEVICES UTILIZED TO PREVENT FALLS:  Life alert? No  Use of a cane, walker or w/c? No  Grab bars in the bathroom? Yes  Shower chair or bench in shower? No  Elevated toilet seat or a handicapped toilet? No   TIMED UP AND  GO:  Was the test performed? No . Phone visit   Cognitive Function:Normal cognitive status assessed by this Nurse Health Advisor. No abnormalities found.   MMSE - Mini Mental State Exam 10/11/2016  Orientation to time 5  Orientation to Place 5  Registration 3  Attention/ Calculation 5  Recall 3  Language- name 2 objects 2  Language- repeat 1  Language- follow 3 step command 3  Language- read & follow direction 1  Write a sentence 1  Copy design 1  Total score 30        Immunizations Immunization History  Administered Date(s) Administered   Influenza Whole 08/16/2010, 07/27/2011   Influenza, High Dose Seasonal PF 10/11/2016, 06/17/2019   Influenza-Unspecified 06/08/2013, 08/19/2018, 06/09/2019, 08/24/2020   Moderna Sars-Covid-2 Vaccination 11/19/2019, 12/17/2019, 08/08/2020, 01/25/2021   Pneumococcal Conjugate-13 10/11/2016   Pneumococcal Polysaccharide-23 12/24/2013   Tdap 08/28/2011   Zoster, Live 06/20/2010    TDAP status: Up to date  Flu Vaccine status: Due, Education has been provided regarding the importance of this vaccine. Advised may receive this vaccine at local pharmacy or Health Dept. Aware to provide a copy of the vaccination record if obtained from local pharmacy or Health Dept. Verbalized acceptance and understanding.  Pneumococcal vaccine status: Up to date  Covid-19 vaccine status: Completed vaccines  Qualifies for Shingles Vaccine? Yes   Zostavax completed Yes   Shingrix Completed?: No.    Education has been provided regarding the importance of this vaccine. Patient has been advised to call insurance company to determine out of pocket expense if they have not yet received this vaccine. Advised may also receive vaccine at local pharmacy or Health Dept. Verbalized acceptance and understanding.  Screening Tests Health Maintenance  Topic Date Due   Zoster Vaccines- Shingrix (1 of 2) Never done   MAMMOGRAM  09/24/2020   INFLUENZA VACCINE  05/08/2021    Hepatitis C Screening  10/11/2021 (Originally 09/26/1964)   TETANUS/TDAP  08/27/2021   COLONOSCOPY (Pts 45-62yrs Insurance coverage will need to be confirmed)  10/23/2025   DEXA SCAN  Completed   COVID-19 Vaccine  Completed   PNA vac Low Risk Adult  Completed  HPV VACCINES  Aged Out    Health Maintenance  Health Maintenance Due  Topic Date Due   Zoster Vaccines- Shingrix (1 of 2) Never done   MAMMOGRAM  09/24/2020   INFLUENZA VACCINE  05/08/2021    Colorectal cancer screening: Type of screening: Colonoscopy. Completed 10/24/2015. Repeat every 10 years  Mammogram status: Patient states she has had within the last year at GYN office. Requested copy of report be sent to PCP.  Done Density status:Patient states she has had within the last year at GYN office. Requested copy of report be sent to PCP.  Lung Cancer Screening: (Low Dose CT Chest recommended if Age 60-80 years, 30 pack-year currently smoking OR have quit w/in 15years.) does not qualify.     Additional Screening:  Hepatitis C Screening: does qualify; Declined  Vision Screening: Recommended annual ophthalmology exams for early detection of glaucoma and other disorders of the eye. Is the patient up to date with their annual eye exam?  Yes  Who is the provider or what is the name of the office in which the patient attends annual eye exams? Dr. Jimmey Ralph   Dental Screening: Recommended annual dental exams for proper oral hygiene  Community Resource Referral / Chronic Care Management: CRR required this visit?  No   CCM required this visit?  No      Plan:     I have personally reviewed and noted the following in the patient's chart:   Medical and social history Use of alcohol, tobacco or illicit drugs  Current medications and supplements including opioid prescriptions.  Functional ability and status Nutritional status Physical activity Advanced directives List of other physicians Hospitalizations, surgeries,  and ER visits in previous 12 months Vitals Screenings to include cognitive, depression, and falls Referrals and appointments  In addition, I have reviewed and discussed with patient certain preventive protocols, quality metrics, and best practice recommendations. A written personalized care plan for preventive services as well as general preventive health recommendations were provided to patient.   Due to this being a telephonic visit, the after visit summary with patients personalized plan was offered to patient via mail or my-chart. Patient would like to access on my-chart.   Roanna Raider, LPN   3/49/1791  Nurse health Advisor  Nurse Notes: None  I have reviewed and agree with Health Coaches documentation.  Willow Ora, MD

## 2021-06-21 NOTE — Patient Instructions (Signed)
Ms. Tonya Sheppard , Thank you for taking time to complete your Medicare Wellness Visit. I appreciate your ongoing commitment to your health goals. Please review the following plan we discussed and let me know if I can assist you in the future.   Screening recommendations/referrals: Colonoscopy: Completed 10/24/2015-Due 10/23/2025 Mammogram: Per our conversation, completed at [facility] office. Please have copy of report sent to PCP. Bone Density: Per our conversation, completed at [facility] office. Please have copy of report sent to PCP. Recommended yearly ophthalmology/optometry visit for glaucoma screening and checkup Recommended yearly dental visit for hygiene and checkup  Vaccinations: Influenza vaccine: Due-May obtain vaccine at our office or your local pharmacy. Pneumococcal vaccine: Up to date Tdap vaccine: Due-08/2021-Discuss with pharmacy Shingles vaccine: Completed vaccine. Please bring dates for your chart.   Covid-19:May obtain newest booster at the pharmacy.  Advanced directives: Please bring a copy for your chart.  Conditions/risks identified: See problem list  Next appointment: Follow up in one year for your annual wellness visit    Preventive Care 65 Years and Older, Female Preventive care refers to lifestyle choices and visits with your health care provider that can promote health and wellness. What does preventive care include? A yearly physical exam. This is also called an annual well check. Dental exams once or twice a year. Routine eye exams. Ask your health care provider how often you should have your eyes checked. Personal lifestyle choices, including: Daily care of your teeth and gums. Regular physical activity. Eating a healthy diet. Avoiding tobacco and drug use. Limiting alcohol use. Practicing safe sex. Taking low-dose aspirin every day. Taking vitamin and mineral supplements as recommended by your health care provider. What happens during an annual well check? The  services and screenings done by your health care provider during your annual well check will depend on your age, overall health, lifestyle risk factors, and family history of disease. Counseling  Your health care provider may ask you questions about your: Alcohol use. Tobacco use. Drug use. Emotional well-being. Home and relationship well-being. Sexual activity. Eating habits. History of falls. Memory and ability to understand (cognition). Work and work Astronomer. Reproductive health. Screening  You may have the following tests or measurements: Height, weight, and BMI. Blood pressure. Lipid and cholesterol levels. These may be checked every 5 years, or more frequently if you are over 61 years old. Skin check. Lung cancer screening. You may have this screening every year starting at age 108 if you have a 30-pack-year history of smoking and currently smoke or have quit within the past 15 years. Fecal occult blood test (FOBT) of the stool. You may have this test every year starting at age 64. Flexible sigmoidoscopy or colonoscopy. You may have a sigmoidoscopy every 5 years or a colonoscopy every 10 years starting at age 30. Hepatitis C blood test. Hepatitis B blood test. Sexually transmitted disease (STD) testing. Diabetes screening. This is done by checking your blood sugar (glucose) after you have not eaten for a while (fasting). You may have this done every 1-3 years. Bone density scan. This is done to screen for osteoporosis. You may have this done starting at age 22. Mammogram. This may be done every 1-2 years. Talk to your health care provider about how often you should have regular mammograms. Talk with your health care provider about your test results, treatment options, and if necessary, the need for more tests. Vaccines  Your health care provider may recommend certain vaccines, such as: Influenza vaccine. This is recommended  every year. Tetanus, diphtheria, and acellular  pertussis (Tdap, Td) vaccine. You may need a Td booster every 10 years. Zoster vaccine. You may need this after age 20. Pneumococcal 13-valent conjugate (PCV13) vaccine. One dose is recommended after age 59. Pneumococcal polysaccharide (PPSV23) vaccine. One dose is recommended after age 80. Talk to your health care provider about which screenings and vaccines you need and how often you need them. This information is not intended to replace advice given to you by your health care provider. Make sure you discuss any questions you have with your health care provider. Document Released: 10/21/2015 Document Revised: 06/13/2016 Document Reviewed: 07/26/2015 Elsevier Interactive Patient Education  2017 [place] Prevention in the Home Falls can cause injuries. They can happen to people of all ages. There are many things you can do to make your home safe and to help prevent falls. What can I do on the outside of my home? Regularly fix the edges of walkways and driveways and fix any cracks. Remove anything that might make you trip as you walk through a door, such as a raised step or threshold. Trim any bushes or trees on the path to your home. Use bright outdoor lighting. Clear any walking paths of anything that might make someone trip, such as rocks or tools. Regularly check to see if handrails are loose or broken. Make sure that both sides of any steps have handrails. Any raised decks and porches should have guardrails on the edges. Have any leaves, snow, or ice cleared regularly. Use sand or salt on walking paths during winter. Clean up any spills in your garage right away. This includes oil or grease spills. What can I do in the bathroom? Use night lights. Install grab bars by the toilet and in the tub and shower. Do not use towel bars as grab bars. Use non-skid mats or decals in the tub or shower. If you need to sit down in the shower, use a plastic, non-slip stool. Keep the floor  dry. Clean up any water that spills on the floor as soon as it happens. Remove soap buildup in the tub or shower regularly. Attach bath mats securely with double-sided non-slip rug tape. Do not have throw rugs and other things on the floor that can make you trip. What can I do in the bedroom? Use night lights. Make sure that you have a light by your bed that is easy to reach. Do not use any sheets or blankets that are too big for your bed. They should not hang down onto the floor. Have a firm chair that has side arms. You can use this for support while you get dressed. Do not have throw rugs and other things on the floor that can make you trip. What can I do in the kitchen? Clean up any spills right away. Avoid walking on wet floors. Keep items that you use a lot in easy-to-reach places. If you need to reach something above you, use a strong step stool that has a grab bar. Keep electrical cords out of the way. Do not use floor polish or wax that makes floors slippery. If you must use wax, use non-skid floor wax. Do not have throw rugs and other things on the floor that can make you trip. What can I do with my stairs? Do not leave any items on the stairs. Make sure that there are handrails on both sides of the stairs and use them. Fix handrails that are broken  or loose. Make sure that handrails are as long as the stairways. Check any carpeting to make sure that it is firmly attached to the stairs. Fix any carpet that is loose or worn. Avoid having throw rugs at the top or bottom of the stairs. If you do have throw rugs, attach them to the floor with carpet tape. Make sure that you have a light switch at the top of the stairs and the bottom of the stairs. If you do not have them, ask someone to add them for you. What else can I do to help prevent falls? Wear shoes that: Do not have high heels. Have rubber bottoms. Are comfortable and fit you well. Are closed at the toe. Do not wear  sandals. If you use a stepladder: Make sure that it is fully opened. Do not climb a closed stepladder. Make sure that both sides of the stepladder are locked into place. Ask someone to hold it for you, if possible. Clearly mark and make sure that you can see: Any grab bars or handrails. First and last steps. Where the edge of each step is. Use tools that help you move around (mobility aids) if they are needed. These include: Canes. Walkers. Scooters. Crutches. Turn on the lights when you go into a dark area. Replace any light bulbs as soon as they burn out. Set up your furniture so you have a clear path. Avoid moving your furniture around. If any of your floors are uneven, fix them. If there are any pets around you, be aware of where they are. Review your medicines with your doctor. Some medicines can make you feel dizzy. This can increase your chance of falling. Ask your doctor what other things that you can do to help prevent falls. This information is not intended to replace advice given to you by your health care provider. Make sure you discuss any questions you have with your health care provider. Document Released: 07/21/2009 Document Revised: 03/01/2016 Document Reviewed: 10/29/2014 Elsevier Interactive Patient Education  2017 [employer].

## 2021-08-09 DIAGNOSIS — Z01419 Encounter for gynecological examination (general) (routine) without abnormal findings: Secondary | ICD-10-CM | POA: Diagnosis not present

## 2021-08-09 DIAGNOSIS — Z6821 Body mass index (BMI) 21.0-21.9, adult: Secondary | ICD-10-CM | POA: Diagnosis not present

## 2021-10-28 DIAGNOSIS — Z1231 Encounter for screening mammogram for malignant neoplasm of breast: Secondary | ICD-10-CM | POA: Diagnosis not present

## 2021-11-19 ENCOUNTER — Other Ambulatory Visit: Payer: Self-pay | Admitting: Family Medicine

## 2021-11-19 DIAGNOSIS — I10 Essential (primary) hypertension: Secondary | ICD-10-CM

## 2021-12-05 ENCOUNTER — Ambulatory Visit (INDEPENDENT_AMBULATORY_CARE_PROVIDER_SITE_OTHER): Payer: Medicare HMO | Admitting: Family Medicine

## 2021-12-05 ENCOUNTER — Encounter: Payer: Self-pay | Admitting: Family Medicine

## 2021-12-05 VITALS — BP 134/88 | HR 71 | Temp 98.5°F | Resp 18 | Ht 67.0 in | Wt 132.8 lb

## 2021-12-05 DIAGNOSIS — Z1159 Encounter for screening for other viral diseases: Secondary | ICD-10-CM

## 2021-12-05 DIAGNOSIS — M8580 Other specified disorders of bone density and structure, unspecified site: Secondary | ICD-10-CM | POA: Diagnosis not present

## 2021-12-05 DIAGNOSIS — Z0000 Encounter for general adult medical examination without abnormal findings: Secondary | ICD-10-CM

## 2021-12-05 DIAGNOSIS — I10 Essential (primary) hypertension: Secondary | ICD-10-CM | POA: Diagnosis not present

## 2021-12-05 LAB — COMPREHENSIVE METABOLIC PANEL
ALT: 13 U/L (ref 0–35)
AST: 24 U/L (ref 0–37)
Albumin: 4.2 g/dL (ref 3.5–5.2)
Alkaline Phosphatase: 43 U/L (ref 39–117)
BUN: 16 mg/dL (ref 6–23)
CO2: 27 mEq/L (ref 19–32)
Calcium: 9.3 mg/dL (ref 8.4–10.5)
Chloride: 104 mEq/L (ref 96–112)
Creatinine, Ser: 0.64 mg/dL (ref 0.40–1.20)
GFR: 86.59 mL/min (ref >60.00)
Glucose, Bld: 93 mg/dL (ref 70–99)
Potassium: 4.8 mEq/L (ref 3.5–5.1)
Sodium: 139 mEq/L (ref 135–145)
Total Bilirubin: 0.6 mg/dL (ref 0.2–1.2)
Total Protein: 7.1 g/dL (ref 6.0–8.3)

## 2021-12-05 LAB — LIPID PANEL
Cholesterol: 266 mg/dL — ABNORMAL HIGH (ref 0–200)
HDL: 76.3 mg/dL (ref >39.00)
LDL Cholesterol: 173 mg/dL — ABNORMAL HIGH (ref 0–99)
NonHDL: 189.97
Total CHOL/HDL Ratio: 3
Triglycerides: 83 mg/dL (ref 0.0–149.0)
VLDL: 16.6 mg/dL (ref 0.0–40.0)

## 2021-12-05 LAB — CBC WITH DIFFERENTIAL/PLATELET
Basophils Absolute: 0.1 10*3/uL (ref 0.0–0.1)
Basophils Relative: 0.9 % (ref 0.0–3.0)
Eosinophils Absolute: 0.2 10*3/uL (ref 0.0–0.7)
Eosinophils Relative: 2.1 % (ref 0.0–5.0)
HCT: 39.1 % (ref 36.0–46.0)
Hemoglobin: 12.8 g/dL (ref 12.0–15.0)
Lymphocytes Relative: 26.3 % (ref 12.0–46.0)
Lymphs Abs: 2.1 10*3/uL (ref 0.7–4.0)
MCHC: 32.9 g/dL (ref 30.0–36.0)
MCV: 92 fl (ref 78.0–100.0)
Monocytes Absolute: 0.7 10*3/uL (ref 0.1–1.0)
Monocytes Relative: 8.4 % (ref 3.0–12.0)
Neutro Abs: 5 10*3/uL (ref 1.4–7.7)
Neutrophils Relative %: 62.3 % (ref 43.0–77.0)
Platelets: 304 10*3/uL (ref 150.0–400.0)
RBC: 4.25 Mil/uL (ref 3.87–5.11)
RDW: 13.5 % (ref 11.5–15.5)
WBC: 8 10*3/uL (ref 4.0–10.5)

## 2021-12-05 MED ORDER — ALENDRONATE SODIUM 70 MG PO TABS: 70.0000 mg | ORAL_TABLET | ORAL | 3 refills | Status: DC

## 2021-12-05 NOTE — Progress Notes (Signed)
Subjective:     Tonya Sheppard is a 75 y.o. female and is here for a comprehensive physical exam. The patient reports no problems.  Social History   Socioeconomic History   Marital status: Married    Spouse name: Not on file   Number of children: Not on file   Years of education: Not on file   Highest education level: Not on file  Occupational History   Occupation: financial analyst--retired    Employer: [employer]  Tobacco Use   Smoking status: Never   Smokeless tobacco: Never  Substance and Sexual Activity   Alcohol use: Yes    Alcohol/week: 7.0 - 14.0 standard drinks    Types: 7 - 14 Glasses of wine per week   Drug use: No   Sexual activity: Yes    Partners: Male  Other Topics Concern   Not on file  Social History Narrative   Exercise- [employer], yard work   Investment banker, operational of Radio broadcast assistant Strain: Low Risk    Difficulty of Paying Living Expenses: Not hard at all  Food Insecurity: No Food Insecurity   Worried About Charity fundraiser in the Last Year: Never true   Arboriculturist in the Last Year: Never true  Transportation Needs: No Transportation Needs   Lack of Transportation (Medical): No   Lack of Transportation (Non-Medical): No  Physical Activity: Insufficiently Active   Days of Exercise per Week: 3 days   Minutes of Exercise per Session: 30 min  Stress: No Stress Concern Present   Feeling of Stress : Not at all  Social Connections: Moderately Integrated   Frequency of Communication with Friends and Family: Once a week   Frequency of Social Gatherings with Friends and Family: Once a week   Attends Religious Services: 1 to 4 times per year   Active Member of [employer] or Organizations: Yes   Attends Archivist Meetings: 1 to 4 times per year   Marital Status: Married  Human resources officer Violence: Not At Risk   Fear of Current or Ex-Partner: No   Emotionally Abused: No   Physically Abused: No   Sexually Abused: No    Health Maintenance  Topic Date Due   Hepatitis C Screening  Never done   Zoster Vaccines- Shingrix (1 of 2) Never done   TETANUS/TDAP  08/27/2021   MAMMOGRAM  10/30/2022   COLONOSCOPY (Pts 45-29yrs Insurance coverage will need to be confirmed)  10/23/2025   Pneumonia Vaccine 79+ Years old  Completed   INFLUENZA VACCINE  Completed   DEXA SCAN  Completed   COVID-19 Vaccine  Completed   HPV VACCINES  Aged Out    The following portions of the patient's history were reviewed and updated as appropriate: She  has a past medical history of Osteopenia. She does not have any pertinent problems on file. She  has a past surgical history that includes Tubal ligation; Eye surgery; and Cataract extraction. Her family history includes Alcohol abuse in her brother and sister; Cancer (age of onset: 69) in her sister; Esophageal cancer in her sister; Heart disease in her mother; Heart disease (age of onset: 47) in her father; Hepatitis in her brother; Hypertension in her mother. She  reports that she has never smoked. She has never used smokeless tobacco. She reports current alcohol use of about 7.0 - 14.0 standard drinks per week. She reports that she does not use drugs. She has a current medication list which  includes the following prescription(s): alendronate, lisinopril, and meloxicam. Current Outpatient Medications on File Prior to Visit  Medication Sig Dispense Refill   lisinopril (ZESTRIL) 10 MG tablet TAKE ONE TABLET BY MOUTH DAILY 90 tablet 1   meloxicam (MOBIC) 15 MG tablet Take 1 tablet (15 mg total) by mouth daily. 90 tablet 1   No current facility-administered medications on file prior to visit.   She has No Known Allergies..  Review of Systems Review of Systems  Constitutional: Negative for activity change, appetite change and fatigue.  HENT: Negative for hearing loss, congestion, tinnitus and ear discharge.  dentist q653m Eyes: Negative for visual disturbance (see optho q1y -- vision  corrected to 20/20 with glasses).  Respiratory: Negative for cough, chest tightness and shortness of breath.   Cardiovascular: Negative for chest pain, palpitations and leg swelling.  Gastrointestinal: Negative for abdominal pain, diarrhea, constipation and abdominal distention.  Genitourinary: Negative for urgency, frequency, decreased urine volume and difficulty urinating.  Musculoskeletal: Negative for back pain, arthralgias and gait problem.  Skin: Negative for color change, pallor and rash.  Neurological: Negative for dizziness, light-headedness, numbness and headaches.  Hematological: Negative for adenopathy. Does not bruise/bleed easily.  Psychiatric/Behavioral: Negative for suicidal ideas, confusion, sleep disturbance, self-injury, dysphoric mood, decreased concentration and agitation.      Objective:.    BP 134/88 (BP Location: Right Arm, Patient Position: Sitting, Cuff Size: Normal)    Pulse 71    Temp 98.5 F (36.9 C) (Oral)    Resp 18    Ht 5\' 7"  (1.702 m)    Wt 132 lb 12.8 oz (60.2 kg)    SpO2 98%    BMI 20.80 kg/m  General appearance: alert, cooperative, appears stated age, and no distress Head: Normocephalic, without obvious abnormality, atraumatic Eyes: negative findings: lids and lashes normal, conjunctivae and sclerae normal, and pupils equal, round, reactive to light and accomodation Ears: normal TM's and external ear canals both ears Nose: Nares normal. Septum midline. Mucosa normal. No drainage or sinus tenderness. Throat: lips, mucosa, and tongue normal; teeth and gums normal Neck: no adenopathy, no carotid bruit, no JVD, supple, symmetrical, trachea midline, and thyroid not enlarged, symmetric, no tenderness/mass/nodules Back: symmetric, no curvature. ROM normal. No CVA tenderness. Lungs: clear to auscultation bilaterally Heart: regular rate and rhythm, S1, S2 normal, no murmur, click, rub or gallop Abdomen: soft, non-tender; bowel sounds normal; no masses,  no  organomegaly Extremities: extremities normal, atraumatic, no cyanosis or edema Pulses: 2+ and symmetric Skin: Skin color, texture, turgor normal. No rashes or lesions Lymph nodes: Cervical, supraclavicular, and axillary nodes normal. Neurologic: Alert and oriented X 3, normal strength and tone. Normal symmetric reflexes. Normal coordination and gait    Assessment:    Healthy female exam.      Plan:    Ghm utd Check labs  See After Visit Summary for Counseling Recommendations    1. Osteopenia, unspecified location   - alendronate (FOSAMAX) 70 MG tablet; Take 1 tablet (70 mg total) by mouth every 7 (seven) days. Take with a full glass of water on an empty stomach.  Dispense: 12 tablet; Refill: 3 - Comprehensive metabolic panel  2. Need for hepatitis C screening test   - Hepatitis C antibody  3. Primary hypertension Well controlled, no changes to meds. Encouraged heart healthy diet such as the DASH diet and exercise as tolerated.   - CBC with Differential/Platelet - Comprehensive metabolic panel - Lipid panel  4. Preventative health care See above

## 2021-12-05 NOTE — Patient Instructions (Signed)
Preventive Care 65 Years and Older, Female °Preventive care refers to lifestyle choices and visits with your health care provider that can promote health and wellness. Preventive care visits are also called wellness exams. °What can I expect for my preventive care visit? °Counseling °Your health care provider may ask you questions about your: °Medical history, including: °Past medical problems. °Family medical history. °Pregnancy and menstrual history. °History of falls. °Current health, including: °Memory and ability to understand (cognition). °Emotional well-being. °Home life and relationship well-being. °Sexual activity and sexual health. °Lifestyle, including: °Alcohol, nicotine or tobacco, and drug use. °Access to firearms. °Diet, exercise, and sleep habits. °Work and work environment. °Sunscreen use. °Safety issues such as seatbelt and bike helmet use. °Physical exam °Your health care provider will check your: °Height and weight. These may be used to calculate your BMI (body mass index). BMI is a measurement that tells if you are at a healthy weight. °Waist circumference. This measures the distance around your waistline. This measurement also tells if you are at a healthy weight and may help predict your risk of certain diseases, such as type 2 diabetes and high blood pressure. °Heart rate and blood pressure. °Body temperature. °Skin for abnormal spots. °What immunizations do I need? °Vaccines are usually given at various ages, according to a schedule. Your health care provider will recommend vaccines for you based on your age, medical history, and lifestyle or other factors, such as travel or where you work. °What tests do I need? °Screening °Your health care provider may recommend screening tests for certain conditions. This may include: °Lipid and cholesterol levels. °Hepatitis C test. °Hepatitis B test. °HIV (human immunodeficiency virus) test. °STI (sexually transmitted infection) testing, if you are at  risk. °Lung cancer screening. °Colorectal cancer screening. °Diabetes screening. This is done by checking your blood sugar (glucose) after you have not eaten for a while (fasting). °Mammogram. Talk with your health care provider about how often you should have regular mammograms. °BRCA-related cancer screening. This may be done if you have a family history of breast, ovarian, tubal, or peritoneal cancers. °Bone density scan. This is done to screen for osteoporosis. °Talk with your health care provider about your test results, treatment options, and if necessary, the need for more tests. °Follow these instructions at home: °Eating and drinking ° °Eat a diet that includes fresh fruits and vegetables, whole grains, lean protein, and low-fat dairy products. Limit your intake of foods with high amounts of sugar, saturated fats, and salt. °Take vitamin and mineral supplements as recommended by your health care provider. °Do not drink alcohol if your health care provider tells you not to drink. °If you drink alcohol: °Limit how much you have to 0-1 drink a day. °Know how much alcohol is in your drink. In the [place] one drink equals one 12 oz bottle of beer (355 mL), one 5 oz glass of wine (148 mL), or one 1½ oz glass of hard liquor (44 mL). °Lifestyle °Brush your teeth every morning and night with fluoride toothpaste. Floss one time each day. °Exercise for at least 30 minutes 5 or more days each week. °Do not use any products that contain nicotine or tobacco. These products include cigarettes, chewing tobacco, and vaping devices, such as e-cigarettes. If you need help quitting, ask your health care provider. °Do not use drugs. °If you are sexually active, practice safe sex. Use a condom or other form of protection in order to prevent STIs. °Take aspirin only as told by your   health care provider. Make sure that you understand how much to take and what form to take. Work with your health care provider to find out whether it  is safe and beneficial for you to take aspirin daily. Ask your health care provider if you need to take a cholesterol-lowering medicine (statin). Find healthy ways to manage stress, such as: Meditation, yoga, or listening to music. Journaling. Talking to a trusted person. Spending time with friends and family. Minimize exposure to UV radiation to reduce your risk of skin cancer. Safety Always wear your seat belt while driving or riding in a vehicle. Do not drive: If you have been drinking alcohol. Do not ride with someone who has been drinking. When you are tired or distracted. While texting. If you have been using any mind-altering substances or drugs. Wear a helmet and other protective equipment during sports activities. If you have firearms in your house, make sure you follow all gun safety procedures. What's next? Visit your health care provider once a year for an annual wellness visit. Ask your health care provider how often you should have your eyes and teeth checked. Stay up to date on all vaccines. This information is not intended to replace advice given to you by your health care provider. Make sure you discuss any questions you have with your health care provider. Document Revised: 03/22/2021 Document Reviewed: 03/22/2021 Elsevier Patient Education  [place].

## 2021-12-06 ENCOUNTER — Encounter: Payer: Self-pay | Admitting: Family Medicine

## 2021-12-06 LAB — HEPATITIS C ANTIBODY
Hepatitis C Ab: NONREACTIVE
SIGNAL TO CUT-OFF: <0.02 (ref <1.00)

## 2021-12-11 ENCOUNTER — Other Ambulatory Visit: Payer: Self-pay

## 2021-12-11 DIAGNOSIS — E785 Hyperlipidemia, unspecified: Secondary | ICD-10-CM

## 2022-02-03 ENCOUNTER — Other Ambulatory Visit: Payer: Self-pay | Admitting: Family Medicine

## 2022-02-03 DIAGNOSIS — M25569 Pain in unspecified knee: Secondary | ICD-10-CM

## 2022-05-25 ENCOUNTER — Other Ambulatory Visit: Payer: Self-pay | Admitting: Family Medicine

## 2022-05-25 DIAGNOSIS — I10 Essential (primary) hypertension: Secondary | ICD-10-CM

## 2022-07-02 DIAGNOSIS — H524 Presbyopia: Secondary | ICD-10-CM | POA: Diagnosis not present

## 2022-07-02 DIAGNOSIS — H52212 Irregular astigmatism, left eye: Secondary | ICD-10-CM | POA: Diagnosis not present

## 2022-07-16 ENCOUNTER — Ambulatory Visit (INDEPENDENT_AMBULATORY_CARE_PROVIDER_SITE_OTHER): Payer: Medicare HMO

## 2022-07-16 VITALS — Ht 67.0 in | Wt 132.0 lb

## 2022-07-16 DIAGNOSIS — Z0000 Encounter for general adult medical examination without abnormal findings: Secondary | ICD-10-CM

## 2022-07-16 NOTE — Patient Instructions (Signed)
Tonya Sheppard , Thank you for taking time to complete your Medicare Wellness Visit. I appreciate your ongoing commitment to your health goals. Please review the following plan we discussed and let me know if I can assist you in the future.   Screening recommendations/referrals: Colonoscopy: Completed 10/24/2015-Due 10/23/2025 Mammogram: Completed 10/30/2021-Due 10/30/2022 Bone Density: Per our conversation, scheduled for 09/2022 Recommended yearly ophthalmology/optometry visit for glaucoma screening and checkup Recommended yearly dental visit for hygiene and checkup  Vaccinations: Influenza vaccine: Due-May obtain vaccine at our office or your local pharmacy. Pneumococcal vaccine: Up to date Tdap vaccine: Due-May obtain vaccine at your local pharmacy. Shingles vaccine: Completed vaccines   Covid-19:May obtain vaccine at your local pharmacy.  Advanced directives: Please bring a copy of Living Will and/or Healthcare Power of Attorney for your chart.   Conditions/risks identified: See problem list  Next appointment: Follow up in one year for your annual wellness visit    Preventive Care 65 Years and Older, Female Preventive care refers to lifestyle choices and visits with your health care provider that can promote health and wellness. What does preventive care include? A yearly physical exam. This is also called an annual well check. Dental exams once or twice a year. Routine eye exams. Ask your health care provider how often you should have your eyes checked. Personal lifestyle choices, including: Daily care of your teeth and gums. Regular physical activity. Eating a healthy diet. Avoiding tobacco and drug use. Limiting alcohol use. Practicing safe sex. Taking low-dose aspirin every day. Taking vitamin and mineral supplements as recommended by your health care provider. What happens during an annual well check? The services and screenings done by your health care provider during your  annual well check will depend on your age, overall health, lifestyle risk factors, and family history of disease. Counseling  Your health care provider may ask you questions about your: Alcohol use. Tobacco use. Drug use. Emotional well-being. Home and relationship well-being. Sexual activity. Eating habits. History of falls. Memory and ability to understand (cognition). Work and work Statistician. Reproductive health. Screening  You may have the following tests or measurements: Height, weight, and BMI. Blood pressure. Lipid and cholesterol levels. These may be checked every 5 years, or more frequently if you are over 76 years old. Skin check. Lung cancer screening. You may have this screening every year starting at age 76 if you have a 30-pack-year history of smoking and currently smoke or have quit within the past 15 years. Fecal occult blood test (FOBT) of the stool. You may have this test every year starting at age 76. Flexible sigmoidoscopy or colonoscopy. You may have a sigmoidoscopy every 5 years or a colonoscopy every 10 years starting at age 76. Hepatitis C blood test. Hepatitis B blood test. Sexually transmitted disease (STD) testing. Diabetes screening. This is done by checking your blood sugar (glucose) after you have not eaten for a while (fasting). You may have this done every 1-3 years. Bone density scan. This is done to screen for osteoporosis. You may have this done starting at age 76. Mammogram. This may be done every 1-2 years. Talk to your health care provider about how often you should have regular mammograms. Talk with your health care provider about your test results, treatment options, and if necessary, the need for more tests. Vaccines  Your health care provider may recommend certain vaccines, such as: Influenza vaccine. This is recommended every year. Tetanus, diphtheria, and acellular pertussis (Tdap, Td) vaccine. You may need a  Td booster every 10  years. Zoster vaccine. You may need this after age 76. Pneumococcal 13-valent conjugate (PCV13) vaccine. One dose is recommended after age 76. Pneumococcal polysaccharide (PPSV23) vaccine. One dose is recommended after age 65. Talk to your health care provider about which screenings and vaccines you need and how often you need them. This information is not intended to replace advice given to you by your health care provider. Make sure you discuss any questions you have with your health care provider. Document Released: 10/21/2015 Document Revised: 06/13/2016 Document Reviewed: 07/26/2015 Elsevier Interactive Patient Education  2017 [place] Prevention in the Home Falls can cause injuries. They can happen to people of all ages. There are many things you can do to make your home safe and to help prevent falls. What can I do on the outside of my home? Regularly fix the edges of walkways and driveways and fix any cracks. Remove anything that might make you trip as you walk through a door, such as a raised step or threshold. Trim any bushes or trees on the path to your home. Use bright outdoor lighting. Clear any walking paths of anything that might make someone trip, such as rocks or tools. Regularly check to see if handrails are loose or broken. Make sure that both sides of any steps have handrails. Any raised decks and porches should have guardrails on the edges. Have any leaves, snow, or ice cleared regularly. Use sand or salt on walking paths during winter. Clean up any spills in your garage right away. This includes oil or grease spills. What can I do in the bathroom? Use night lights. Install grab bars by the toilet and in the tub and shower. Do not use towel bars as grab bars. Use non-skid mats or decals in the tub or shower. If you need to sit down in the shower, use a plastic, non-slip stool. Keep the floor dry. Clean up any water that spills on the floor as soon as it  happens. Remove soap buildup in the tub or shower regularly. Attach bath mats securely with double-sided non-slip rug tape. Do not have throw rugs and other things on the floor that can make you trip. What can I do in the bedroom? Use night lights. Make sure that you have a light by your bed that is easy to reach. Do not use any sheets or blankets that are too big for your bed. They should not hang down onto the floor. Have a firm chair that has side arms. You can use this for support while you get dressed. Do not have throw rugs and other things on the floor that can make you trip. What can I do in the kitchen? Clean up any spills right away. Avoid walking on wet floors. Keep items that you use a lot in easy-to-reach places. If you need to reach something above you, use a strong step stool that has a grab bar. Keep electrical cords out of the way. Do not use floor polish or wax that makes floors slippery. If you must use wax, use non-skid floor wax. Do not have throw rugs and other things on the floor that can make you trip. What can I do with my stairs? Do not leave any items on the stairs. Make sure that there are handrails on both sides of the stairs and use them. Fix handrails that are broken or loose. Make sure that handrails are as long as the stairways. Check any  carpeting to make sure that it is firmly attached to the stairs. Fix any carpet that is loose or worn. Avoid having throw rugs at the top or bottom of the stairs. If you do have throw rugs, attach them to the floor with carpet tape. Make sure that you have a light switch at the top of the stairs and the bottom of the stairs. If you do not have them, ask someone to add them for you. What else can I do to help prevent falls? Wear shoes that: Do not have high heels. Have rubber bottoms. Are comfortable and fit you well. Are closed at the toe. Do not wear sandals. If you use a stepladder: Make sure that it is fully opened.  Do not climb a closed stepladder. Make sure that both sides of the stepladder are locked into place. Ask someone to hold it for you, if possible. Clearly mark and make sure that you can see: Any grab bars or handrails. First and last steps. Where the edge of each step is. Use tools that help you move around (mobility aids) if they are needed. These include: Canes. Walkers. Scooters. Crutches. Turn on the lights when you go into a dark area. Replace any light bulbs as soon as they burn out. Set up your furniture so you have a clear path. Avoid moving your furniture around. If any of your floors are uneven, fix them. If there are any pets around you, be aware of where they are. Review your medicines with your doctor. Some medicines can make you feel dizzy. This can increase your chance of falling. Ask your doctor what other things that you can do to help prevent falls. This information is not intended to replace advice given to you by your health care provider. Make sure you discuss any questions you have with your health care provider. Document Released: 07/21/2009 Document Revised: 03/01/2016 Document Reviewed: 10/29/2014 Elsevier Interactive Patient Education  2017 [employer].

## 2022-07-16 NOTE — Progress Notes (Signed)
Subjective:   Tonya Sheppard is a 76 y.o. female who presents for Medicare Annual (Subsequent) preventive examination.  I connected with Tonya Sheppard today by telephone and verified that I am speaking with the correct person using two identifiers. Location patient: home Location provider: work Persons participating in the virtual visit: patient, Engineer, civil (consulting).    I discussed the limitations, risks, security and privacy concerns of performing an evaluation and management service by telephone and the availability of in person appointments. I also discussed with the patient that there may be a patient responsible charge related to this service. The patient expressed understanding and verbally consented to this telephonic visit.    Interactive audio and video telecommunications were attempted between this provider and patient, however failed, due to patient having technical difficulties OR patient did not have access to video capability.  We continued and completed visit with audio only.  Some vital signs may be absent or patient reported.   Time Spent with patient on telephone encounter: 20 minutes   Review of Systems     Cardiac Risk Factors include: advanced age (>72men, >75 women);dyslipidemia;hypertension     Objective:    Today's Vitals   07/16/22 1300  Weight: 132 lb (59.9 kg)  Height: 5\' 7"  (1.702 m)   Body mass index is 20.67 kg/m.     07/16/2022    1:02 PM 06/21/2021    9:43 AM 10/11/2016   11:36 AM 10/24/2015    8:36 AM 10/11/2015    1:21 PM  Advanced Directives  Does Patient Have a Medical Advance Directive? Yes Yes Yes Yes Yes  Type of 12/09/2015 of [place];Living will Healthcare Power of [place];Living will Living will  Healthcare Power of [place];Living will  Does patient want to make changes to medical advance directive?     No - Patient declined  Copy of Healthcare Power of Attorney in Chart? No - copy requested No - copy requested  No - copy requested  No - copy requested    Current Medications (verified) Outpatient Encounter Medications as of 07/16/2022  Medication Sig   alendronate (FOSAMAX) 70 MG tablet Take 1 tablet (70 mg total) by mouth every 7 (seven) days. Take with a full glass of water on an empty stomach.   lisinopril (ZESTRIL) 10 MG tablet TAKE ONE TABLET BY MOUTH DAILY   meloxicam (MOBIC) 15 MG tablet TAKE ONE TABLET BY MOUTH DAILY   No facility-administered encounter medications on file as of 07/16/2022.    Allergies (verified) Patient has no known allergies.   History: Past Medical History:  Diagnosis Date   Osteopenia    Past Surgical History:  Procedure Laterality Date   CATARACT EXTRACTION     EYE SURGERY     cateract right eye   TUBAL LIGATION     Family History  Problem Relation Age of Onset   Hypertension Mother    Heart disease Mother    Heart disease Father 70       MI   Cancer Sister 70       ovarian   Hepatitis Brother    Alcohol abuse Brother    Alcohol abuse Sister    Esophageal cancer Sister    Colon cancer Neg Hx    Social History   Socioeconomic History   Marital status: Married    Spouse name: Not on file   Number of children: Not on file   Years of education: Not on file   Highest education level: Not  on file  Occupational History   Occupation: financial analyst--retired    Employer: [employer]  Tobacco Use   Smoking status: Never   Smokeless tobacco: Never  Substance and Sexual Activity   Alcohol use: Yes    Alcohol/week: 7.0 - 14.0 standard drinks of alcohol    Types: 7 - 14 Glasses of wine per week   Drug use: No   Sexual activity: Yes    Partners: Male  Other Topics Concern   Not on file  Social History Narrative   Exercise- [employer], yard work,  [employer]   Social Determinants of Health   Financial Resource Strain: Low Risk  (07/16/2022)   Overall Financial Resource Strain (CARDIA)    Difficulty of Paying Living Expenses: Not hard at all  Food  Insecurity: No Food Insecurity (07/16/2022)   Hunger Vital Sign    Worried About Running Out of Food in the Last Year: Never true    Ran Out of Food in the Last Year: Never true  Transportation Needs: No Transportation Needs (06/21/2021)   PRAPARE - Administrator, Civil ServiceTransportation    Lack of Transportation (Medical): No    Lack of Transportation (Non-Medical): No  Physical Activity: Sufficiently Active (07/16/2022)   Exercise Vital Sign    Days of Exercise per Week: 7 days    Minutes of Exercise per Session: 30 min  Stress: No Stress Concern Present (07/16/2022)   [employer] - Occupational Stress Questionnaire    Feeling of Stress : Not at all  Social Connections: Socially Integrated (07/16/2022)   Social Connection and Isolation Panel [NHANES]    Frequency of Communication with Friends and Family: More than three times a week    Frequency of Social Gatherings with Friends and Family: More than three times a week    Attends Religious Services: More than 4 times per year    Active Member of [employer]ubs or Organizations: Yes    Attends Engineer, structuralClub or Organization Meetings: More than 4 times per year    Marital Status: Married    Tobacco Counseling Counseling given: Not Answered   Clinical Intake:  Pre-visit preparation completed: Yes  Pain : No/denies pain     BMI - recorded: 20.67 Nutritional Status: BMI of 19-24  Normal Nutritional Risks: None Diabetes: No  How often do you need to have someone help you when you read instructions, pamphlets, or other written materials from your doctor or pharmacy?: 1 - Never  Diabetic?No  Interpreter Needed?: No  Information entered by :: Tonya SalesMartha Josyah Achor LPn   Activities of Daily Living    07/16/2022    1:04 PM 07/15/2022    2:09 PM  In your present state of health, do you have any difficulty performing the following activities:  Hearing? 0 0  Vision? 0 0  Difficulty concentrating or making decisions? 0 0  Walking or climbing  stairs? 0 0  Dressing or bathing? 0 0  Doing errands, shopping? 0 0  Preparing Food and eating ? N N  Using the Toilet? N N  In the past six months, have you accidently leaked urine? N N  Do you have problems with loss of bowel control? N N  Managing your Medications? N N  Managing your Finances? N N  Housekeeping or managing your Housekeeping? N N    Patient Care Team: Zola ButtonLowne Chase, Grayling CongressYvonne R, DO as PCP - General Doristine SectionPaul, Vincent, MD as Consulting Physician (Orthopedic Surgery) Richarda OverlieHolland, Richard, MD as Consulting Physician (Obstetrics and Gynecology)  Nelson Chimes, MD as Consulting Physician (Ophthalmology)  Indicate any recent Medical Services you may have received from other than [facility] providers in the past year (date may be approximate).     Assessment:   This is a routine wellness examination for Tonya Sheppard.  Hearing/Vision screen Hearing Screening - Comments:: No issues Vision Screening - Comments:: Last eye exam-07/2022-Dr. Jimmey Ralph  Dietary issues and exercise activities discussed: Current Exercise Habits: Home exercise routine, Type of exercise: Other - see comments (yardwork, gym), Time (Minutes): 30, Frequency (Times/Week): 7, Weekly Exercise (Minutes/Week): 210, Intensity: Mild, Exercise limited by: None identified   Goals Addressed             This Visit's Progress    Healthy Lifestyle   On track    Continue to eat heart healthy diet (full of fruits, vegetables, whole grains, lean protein, water--limit salt, fat, and sugar intake) and increase physical activity as tolerated. Continue doing brain stimulating activities (puzzles, reading, adult coloring books, staying active) to keep memory sharp.        Depression Screen    07/16/2022    1:04 PM 06/21/2021    9:45 AM 11/17/2020    9:42 AM 12/10/2017    2:37 PM 10/11/2016   10:31 AM 12/24/2013    3:31 PM  PHQ 2/9 Scores  PHQ - 2 Score 0 0 0 1 0 0  PHQ- 9 Score    1      Fall Risk    07/16/2022    1:03 PM 07/15/2022     2:09 PM 06/21/2021    9:44 AM 11/17/2020    9:41 AM 12/10/2017    2:37 PM  Fall Risk   Falls in the past year? 0 0 0 0 No  Number falls in past yr: 0 0 0 0   Injury with Fall? 0 0 0 0   Follow up Falls prevention discussed  Falls prevention discussed Falls evaluation completed     FALL RISK PREVENTION PERTAINING TO THE HOME:  Any stairs in or around the home? Yes  If so, are there any without handrails? No  Home free of loose throw rugs in walkways, pet beds, electrical cords, etc? Yes  Adequate lighting in your home to reduce risk of falls? Yes   ASSISTIVE DEVICES UTILIZED TO PREVENT FALLS:  Life alert? No  Use of a cane, walker or w/c? No  Grab bars in the bathroom? Yes  Shower chair or bench in shower? Yes  Elevated toilet seat or a handicapped toilet? No   TIMED UP AND GO:  Was the test performed? No . Phone visit   Cognitive Function:Patient declined MMSE/6CIT. Normal cognitive status assessed by this Nurse Health Advisor. No abnormalities found.      07/16/2022    1:16 PM 10/11/2016   11:39 AM  MMSE - Mini Mental State Exam  Not completed: Refused   Orientation to time  5  Orientation to Place  5  Registration  3  Attention/ Calculation  5  Recall  3  Language- name 2 objects  2  Language- repeat  1  Language- follow 3 step command  3  Language- read & follow direction  1  Write a sentence  1  Copy design  1  Total score  30        Immunizations Immunization History  Administered Date(s) Administered   Influenza Whole 08/16/2010, 07/27/2011   Influenza, High Dose Seasonal PF 10/11/2016, 06/17/2019   Influenza-Unspecified 06/08/2013, 08/19/2018, 06/09/2019, 08/24/2020,  07/08/2021   Moderna Sars-Covid-2 Vaccination 11/19/2019, 12/17/2019, 08/08/2020, 01/25/2021, 06/22/2021   Pneumococcal Conjugate-13 10/11/2016   Pneumococcal Polysaccharide-23 12/24/2013   Tdap 08/28/2011   Zoster Recombinat (Shingrix) 07/28/2018, 09/27/2018   Zoster, Live 06/20/2010     TDAP status: Due, Education has been provided regarding the importance of this vaccine. Advised may receive this vaccine at local pharmacy or Health Dept. Aware to provide a copy of the vaccination record if obtained from local pharmacy or Health Dept. Verbalized acceptance and understanding.  Flu Vaccine status: Due, Education has been provided regarding the importance of this vaccine. Advised may receive this vaccine at local pharmacy or Health Dept. Aware to provide a copy of the vaccination record if obtained from local pharmacy or Health Dept. Verbalized acceptance and understanding.  Pneumococcal vaccine status: Up to date  Covid-19 vaccine status: Information provided on how to obtain vaccines.   Qualifies for Shingles Vaccine? No   Zostavax completed Yes   Shingrix Completed?: Yes  Screening Tests Health Maintenance  Topic Date Due   COVID-19 Vaccine (6 - Moderna series) 08/17/2021   TETANUS/TDAP  08/27/2021   INFLUENZA VACCINE  05/08/2022   MAMMOGRAM  10/30/2022   COLONOSCOPY (Pts 45-76yrs Insurance coverage will need to be confirmed)  10/23/2025   Pneumonia Vaccine 58+ Years old  Completed   DEXA SCAN  Completed   Hepatitis C Screening  Completed   Zoster Vaccines- Shingrix  Completed   HPV VACCINES  Aged Out    Health Maintenance  Health Maintenance Due  Topic Date Due   COVID-19 Vaccine (6 - Moderna series) 08/17/2021   TETANUS/TDAP  08/27/2021   INFLUENZA VACCINE  05/08/2022    Colorectal cancer screening: Type of screening: Colonoscopy. Completed 10/24/2015. Repeat every 10 years  Mammogram status: Completed 10/30/2021. Repeat every year  Bone Density status: Per patient, scheduled for 09/2022  Lung Cancer Screening: (Low Dose CT Chest recommended if Age 18-80 years, 30 pack-year currently smoking OR have quit w/in 15years.) does not qualify.     Additional Screening:  Hepatitis C Screening: Completed 12/05/2021  Vision Screening: Recommended annual  ophthalmology exams for early detection of glaucoma and other disorders of the eye. Is the patient up to date with their annual eye exam?  Yes  Who is the provider or what is the name of the office in which the patient attends annual eye exams? Dr. Jerline Pain   Dental Screening: Recommended annual dental exams for proper oral hygiene  Community Resource Referral / Chronic Care Management: CRR required this visit?  No   CCM required this visit?  No      Plan:     I have personally reviewed and noted the following in the patient's chart:   Medical and social history Use of alcohol, tobacco or illicit drugs  Current medications and supplements including opioid prescriptions. Patient is not currently taking opioid prescriptions. Functional ability and status Nutritional status Physical activity Advanced directives List of other physicians Hospitalizations, surgeries, and ER visits in previous 12 months Vitals Screenings to include cognitive, depression, and falls Referrals and appointments  In addition, I have reviewed and discussed with patient certain preventive protocols, quality metrics, and best practice recommendations. A written personalized care plan for preventive services as well as general preventive health recommendations were provided to patient.   Due to this being a telephonic visit, the after visit summary with patients personalized plan was offered to patient via mail or my-chart. Patient would like to access on my-chart.  Roanna Raider, LPN   88/10/[id number]  Nurse Health Advisor  Nurse Notes: None

## 2022-09-24 DIAGNOSIS — Z01419 Encounter for gynecological examination (general) (routine) without abnormal findings: Secondary | ICD-10-CM | POA: Diagnosis not present

## 2022-09-24 DIAGNOSIS — M8588 Other specified disorders of bone density and structure, other site: Secondary | ICD-10-CM | POA: Diagnosis not present

## 2022-09-24 DIAGNOSIS — Z6821 Body mass index (BMI) 21.0-21.9, adult: Secondary | ICD-10-CM | POA: Diagnosis not present

## 2022-10-29 DIAGNOSIS — Z1231 Encounter for screening mammogram for malignant neoplasm of breast: Secondary | ICD-10-CM | POA: Diagnosis not present

## 2022-10-29 LAB — HM MAMMOGRAPHY

## 2023-03-05 ENCOUNTER — Other Ambulatory Visit (HOSPITAL_BASED_OUTPATIENT_CLINIC_OR_DEPARTMENT_OTHER): Payer: Self-pay

## 2023-03-05 ENCOUNTER — Ambulatory Visit (INDEPENDENT_AMBULATORY_CARE_PROVIDER_SITE_OTHER): Payer: Medicare HMO | Admitting: Family Medicine

## 2023-03-05 ENCOUNTER — Encounter: Payer: Self-pay | Admitting: Family Medicine

## 2023-03-05 VITALS — BP 140/90 | HR 71 | Temp 97.9°F | Resp 18 | Ht 67.0 in | Wt 134.0 lb

## 2023-03-05 DIAGNOSIS — E785 Hyperlipidemia, unspecified: Secondary | ICD-10-CM

## 2023-03-05 DIAGNOSIS — Z0000 Encounter for general adult medical examination without abnormal findings: Secondary | ICD-10-CM | POA: Diagnosis not present

## 2023-03-05 DIAGNOSIS — I10 Essential (primary) hypertension: Secondary | ICD-10-CM | POA: Diagnosis not present

## 2023-03-05 DIAGNOSIS — M25569 Pain in unspecified knee: Secondary | ICD-10-CM

## 2023-03-05 DIAGNOSIS — M8580 Other specified disorders of bone density and structure, unspecified site: Secondary | ICD-10-CM

## 2023-03-05 LAB — CBC WITH DIFFERENTIAL/PLATELET
Basophils Absolute: 0.1 10*3/uL (ref 0.0–0.1)
Basophils Relative: 0.8 % (ref 0.0–3.0)
Eosinophils Absolute: 0.2 10*3/uL (ref 0.0–0.7)
Eosinophils Relative: 1.8 % (ref 0.0–5.0)
HCT: 42.3 % (ref 36.0–46.0)
Hemoglobin: 13.9 g/dL (ref 12.0–15.0)
Lymphocytes Relative: 30.8 % (ref 12.0–46.0)
Lymphs Abs: 2.7 10*3/uL (ref 0.7–4.0)
MCHC: 32.9 g/dL (ref 30.0–36.0)
MCV: 93.2 fl (ref 78.0–100.0)
Monocytes Absolute: 0.7 10*3/uL (ref 0.1–1.0)
Monocytes Relative: 8.5 % (ref 3.0–12.0)
Neutro Abs: 5.1 10*3/uL (ref 1.4–7.7)
Neutrophils Relative %: 58.1 % (ref 43.0–77.0)
Platelets: 315 10*3/uL (ref 150.0–400.0)
RBC: 4.54 Mil/uL (ref 3.87–5.11)
RDW: 13.6 % (ref 11.5–15.5)
WBC: 8.7 10*3/uL (ref 4.0–10.5)

## 2023-03-05 LAB — COMPREHENSIVE METABOLIC PANEL
ALT: 12 U/L (ref 0–35)
AST: 14 U/L (ref 0–37)
Albumin: 4.2 g/dL (ref 3.5–5.2)
Alkaline Phosphatase: 57 U/L (ref 39–117)
BUN: 13 mg/dL (ref 6–23)
CO2: 28 mEq/L (ref 19–32)
Calcium: 9.7 mg/dL (ref 8.4–10.5)
Chloride: 99 mEq/L (ref 96–112)
Creatinine, Ser: 0.68 mg/dL (ref 0.40–1.20)
GFR: 84.59 mL/min (ref >60.00)
Glucose, Bld: 93 mg/dL (ref 70–99)
Potassium: 4.1 mEq/L (ref 3.5–5.1)
Sodium: 138 mEq/L (ref 135–145)
Total Bilirubin: 0.8 mg/dL (ref 0.2–1.2)
Total Protein: 7.2 g/dL (ref 6.0–8.3)

## 2023-03-05 LAB — LIPID PANEL
Cholesterol: 275 mg/dL — ABNORMAL HIGH (ref 0–200)
HDL: 79.6 mg/dL (ref >39.00)
LDL Cholesterol: 169 mg/dL — ABNORMAL HIGH (ref 0–99)
NonHDL: 195.48
Total CHOL/HDL Ratio: 3
Triglycerides: 134 mg/dL (ref 0.0–149.0)
VLDL: 26.8 mg/dL (ref 0.0–40.0)

## 2023-03-05 MED ORDER — MELOXICAM 15 MG PO TABS: 15.0000 mg | ORAL_TABLET | Freq: Every day | ORAL | 1 refills | Status: DC

## 2023-03-05 MED ORDER — TETANUS-DIPHTH-ACELL PERTUSSIS 5-2.5-18.5 LF-MCG/0.5 IM SUSY
0.5000 mL | PREFILLED_SYRINGE | Freq: Once | INTRAMUSCULAR | 0 refills | Status: AC
  Filled 2023-03-05: qty 0.5, 1d supply, fill #0

## 2023-03-05 MED ORDER — LISINOPRIL 10 MG PO TABS: 10.0000 mg | ORAL_TABLET | Freq: Every day | ORAL | 3 refills | Status: DC

## 2023-03-05 NOTE — Assessment & Plan Note (Signed)
Ghm utd Check labs  See AVS Health Maintenance  Topic Date Due   COVID-19 Vaccine (6 - 2023-24 season) 03/21/2023 (Originally 06/08/2022)   INFLUENZA VACCINE  05/09/2023   Medicare Annual Wellness (AWV)  07/17/2023   MAMMOGRAM  10/30/2023   DEXA SCAN  09/24/2024   DTaP/Tdap/Td (3 - Td or Tdap) 03/04/2033   Pneumonia Vaccine 2+ Years old  Completed   Hepatitis C Screening  Completed   Zoster Vaccines- Shingrix  Completed   HPV VACCINES  Aged Out   Colonoscopy  Discontinued

## 2023-03-05 NOTE — Progress Notes (Signed)
Subjective:   By signing my name below, I, Shehryar Baig, attest that this documentation has been prepared under the direction and in the presence of Donato Schultz, DO. 03/05/2023   Patient ID: Tonya Sheppard, female    DOB: 1946/10/08, 77 y.o.   MRN: [id number]  Chief Complaint  Patient presents with   Annual Exam    Pt states fasting     HPI Patient is in today for a comprehensive physical exam.  She is requesting a refill for 10 mg lisinopril and 15 mg meloxicam.  Her blood pressure is elevated during this visit. She continues taking 10 mg lisinopril daily PO and reports no new issues while taking it.  BP Readings from Last 3 Encounters:  03/05/23 (!) 140/90  12/05/21 134/88  05/29/21 122/84   Pulse Readings from Last 3 Encounters:  03/05/23 71  12/05/21 71  05/29/21 64   She denies fever, new moles, congestion, sinus pain, sore throat, chest pain, palpitations, cough, shortness of breath, wheezing, nausea, vomiting, abdominal pain, diarrhea, constipation, dysuria, frequency, hematuria, new muscle pain, new joint pain, or headaches at this time.  She has no changes to her family medical history. She has no new surgical procedures to report.  She received the RSV vaccine a on 02/19/2023 at her [facility]. She is due for her tetanus vaccine and is interested in receiving it at her pharmacy.  Mammogram was last completed 10/29/2022. Results are normal. Repeat in 1 year.  Colonoscopy was last completed 10/24/2015. Result showed: 1. There was mild diverticulosis noted in the sigmoid colon and descending colon 2. The examination was otherwise normal  Repeat in 10 years.  She reports being UTD on her bone density scans and receives them every 2 years at her GYN office.  She is UTD on dental and vision care.    Past Medical History:  Diagnosis Date   Osteopenia     Past Surgical History:  Procedure Laterality Date   CATARACT EXTRACTION     EYE SURGERY      cateract right eye   TUBAL LIGATION      Family History  Problem Relation Age of Onset   Hypertension Mother    Heart disease Mother    Heart disease Father 55       MI   Cancer Sister 102       ovarian   Hepatitis Brother    Alcohol abuse Brother    Alcohol abuse Sister    Esophageal cancer Sister    Colon cancer Neg Hx     Social History   Socioeconomic History   Marital status: Married    Spouse name: Not on file   Number of children: Not on file   Years of education: Not on file   Highest education level: Not on file  Occupational History   Occupation: financial analyst--retired    Employer: [employer]  Tobacco Use   Smoking status: Never   Smokeless tobacco: Never  Substance and Sexual Activity   Alcohol use: Yes    Alcohol/week: 7.0 - 14.0 standard drinks of alcohol    Types: 7 - 14 Glasses of wine per week   Drug use: No   Sexual activity: Yes    Partners: Male  Other Topics Concern   Not on file  Social History Narrative   Exercise- [employer], yard work,  [employer]   Social Determinants of Health   Financial Resource Strain: Low Risk  (  07/16/2022)   Overall Financial Resource Strain (CARDIA)    Difficulty of Paying Living Expenses: Not hard at all  Food Insecurity: No Food Insecurity (07/16/2022)   Hunger Vital Sign    Worried About Running Out of Food in the Last Year: Never true    Ran Out of Food in the Last Year: Never true  Transportation Needs: No Transportation Needs (06/21/2021)   PRAPARE - Administrator, Civil Service (Medical): No    Lack of Transportation (Non-Medical): No  Physical Activity: Sufficiently Active (07/16/2022)   Exercise Vital Sign    Days of Exercise per Week: 7 days    Minutes of Exercise per Session: 30 min  Stress: No Stress Concern Present (07/16/2022)   [employer] of Occupational Health - Occupational Stress Questionnaire    Feeling of Stress : Not at all  Social Connections: Socially  Integrated (07/16/2022)   Social Connection and Isolation Panel [NHANES]    Frequency of Communication with Friends and Family: More than three times a week    Frequency of Social Gatherings with Friends and Family: More than three times a week    Attends Religious Services: More than 4 times per year    Active Member of [employer] or Organizations: Yes    Attends Engineer, structural: More than 4 times per year    Marital Status: Married  Catering manager Violence: Not At Risk (07/16/2022)   Humiliation, Afraid, Rape, and Kick questionnaire    Fear of Current or Ex-Partner: No    Emotionally Abused: No    Physically Abused: No    Sexually Abused: No    Outpatient Medications Prior to Visit  Medication Sig Dispense Refill   lisinopril (ZESTRIL) 10 MG tablet TAKE ONE TABLET BY MOUTH DAILY 90 tablet 2   meloxicam (MOBIC) 15 MG tablet TAKE ONE TABLET BY MOUTH DAILY 90 tablet 1   alendronate (FOSAMAX) 70 MG tablet Take 1 tablet (70 mg total) by mouth every 7 (seven) days. Take with a full glass of water on an empty stomach. 12 tablet 3   No facility-administered medications prior to visit.    No Known Allergies  Review of Systems  Constitutional:  Negative for fever and malaise/fatigue.  HENT:  Negative for congestion, sinus pain and sore throat.   Eyes:  Negative for blurred vision.  Respiratory:  Negative for cough, shortness of breath and wheezing.   Cardiovascular:  Negative for chest pain, palpitations and leg swelling.  Gastrointestinal:  Negative for abdominal pain, blood in stool, constipation, diarrhea, nausea and vomiting.  Genitourinary:  Negative for dysuria, frequency and hematuria.  Musculoskeletal:  Negative for back pain and falls.       (-)new muscle pain (-)new joint pain  Skin:  Negative for rash.       (-)New moles  Neurological:  Negative for dizziness, loss of consciousness and headaches.  Endo/Heme/Allergies:  Negative for environmental allergies.   Psychiatric/Behavioral:  Negative for depression. The patient is not nervous/anxious.        Objective:    Physical Exam Vitals and nursing note reviewed.  Constitutional:      General: She is not in acute distress.    Appearance: Normal appearance. She is not ill-appearing.  HENT:     Head: Normocephalic and atraumatic.     Right Ear: Tympanic membrane, ear canal and external ear normal.     Left Ear: Tympanic membrane, ear canal and external ear normal.     Nose:  Nose normal.  Eyes:     Extraocular Movements: Extraocular movements intact.     Pupils: Pupils are equal, round, and reactive to light.  Cardiovascular:     Rate and Rhythm: Normal rate and regular rhythm.     Heart sounds: Normal heart sounds. No murmur heard.    No gallop.  Pulmonary:     Effort: Pulmonary effort is normal. No respiratory distress.     Breath sounds: Normal breath sounds. No wheezing or rales.  Abdominal:     General: Bowel sounds are normal. There is no distension.     Palpations: Abdomen is soft.     Tenderness: There is no abdominal tenderness. There is no guarding.  Musculoskeletal:        General: Normal range of motion.     Cervical back: Normal range of motion and neck supple.  Skin:    General: Skin is warm and dry.  Neurological:     General: No focal deficit present.     Mental Status: She is alert and oriented to person, place, and time.     Gait: Gait normal.  Psychiatric:        Mood and Affect: Mood normal.        Judgment: Judgment normal.     BP (!) 140/90 (BP Location: Left Arm, Patient Position: Sitting, Cuff Size: Normal)   Pulse 71   Temp 97.9 F (36.6 C) (Oral)   Resp 18   Ht 5\' 7"  (1.702 m)   Wt 134 lb (60.8 kg)   SpO2 96%   BMI 20.99 kg/m  Wt Readings from Last 3 Encounters:  03/05/23 134 lb (60.8 kg)  07/16/22 132 lb (59.9 kg)  12/05/21 132 lb 12.8 oz (60.2 kg)       Assessment & Plan:  Preventative health care Assessment & Plan: Ghm utd Check  labs  See AVS Health Maintenance  Topic Date Due   COVID-19 Vaccine (6 - 2023-24 season) 03/21/2023 (Originally 06/08/2022)   INFLUENZA VACCINE  05/09/2023   Medicare Annual Wellness (AWV)  07/17/2023   MAMMOGRAM  10/30/2023   DEXA SCAN  09/24/2024   DTaP/Tdap/Td (3 - Td or Tdap) 03/04/2033   Pneumonia Vaccine 47+ Years old  Completed   Hepatitis C Screening  Completed   Zoster Vaccines- Shingrix  Completed   HPV VACCINES  Aged Out   Colonoscopy  Discontinued      Osteopenia, unspecified location  Essential hypertension Assessment & Plan: Well controlled, no changes to meds. Encouraged heart healthy diet such as the DASH diet and exercise as tolerated.    Orders: -     Lisinopril; Take 1 tablet (10 mg total) by mouth daily.  Dispense: 90 tablet; Refill: 3 -     Lipid panel -     Comprehensive metabolic panel -     CBC with Differential/Platelet  Knee pain -     Meloxicam; Take 1 tablet (15 mg total) by mouth daily.  Dispense: 90 tablet; Refill: 1  Hyperlipidemia, unspecified hyperlipidemia type -     Lipid panel -     Comprehensive metabolic panel    I, Donato Schultz, DO, personally preformed the services described in this documentation.  All medical record entries made by the scribe were at my direction and in my presence.  I have reviewed the chart and discharge instructions (if applicable) and agree that the record reflects my personal performance and is accurate and complete. 03/05/2023   I,Shehryar Baig,acting as  a scribe for Donato Schultz, DO.,have documented all relevant documentation on the behalf of Donato Schultz, DO,as directed by  Donato Schultz, DO while in the presence of Donato Schultz, DO.   Donato Schultz, DO

## 2023-03-05 NOTE — Assessment & Plan Note (Signed)
Well controlled, no changes to meds. Encouraged heart healthy diet such as the DASH diet and exercise as tolerated.  °

## 2023-03-05 NOTE — Patient Instructions (Signed)
Preventive Care 65 Years and Older, Female Preventive care refers to lifestyle choices and visits with your health care provider that can promote health and wellness. Preventive care visits are also called wellness exams. What can I expect for my preventive care visit? Counseling Your health care provider may ask you questions about your: Medical history, including: Past medical problems. Family medical history. Pregnancy and menstrual history. History of falls. Current health, including: Memory and ability to understand (cognition). Emotional well-being. Home life and relationship well-being. Sexual activity and sexual health. Lifestyle, including: Alcohol, nicotine or tobacco, and drug use. Access to firearms. Diet, exercise, and sleep habits. Work and work environment. Sunscreen use. Safety issues such as seatbelt and bike helmet use. Physical exam Your health care provider will check your: Height and weight. These may be used to calculate your BMI (body mass index). BMI is a measurement that tells if you are at a healthy weight. Waist circumference. This measures the distance around your waistline. This measurement also tells if you are at a healthy weight and may help predict your risk of certain diseases, such as type 2 diabetes and high blood pressure. Heart rate and blood pressure. Body temperature. Skin for abnormal spots. What immunizations do I need?  Vaccines are usually given at various ages, according to a schedule. Your health care provider will recommend vaccines for you based on your age, medical history, and lifestyle or other factors, such as travel or where you work. What tests do I need? Screening Your health care provider may recommend screening tests for certain conditions. This may include: Lipid and cholesterol levels. Hepatitis C test. Hepatitis B test. HIV (human immunodeficiency virus) test. STI (sexually transmitted infection) testing, if you are at  risk. Lung cancer screening. Colorectal cancer screening. Diabetes screening. This is done by checking your blood sugar (glucose) after you have not eaten for a while (fasting). Mammogram. Talk with your health care provider about how often you should have regular mammograms. BRCA-related cancer screening. This may be done if you have a family history of breast, ovarian, tubal, or peritoneal cancers. Bone density scan. This is done to screen for osteoporosis. Talk with your health care provider about your test results, treatment options, and if necessary, the need for more tests. Follow these instructions at home: Eating and drinking  Eat a diet that includes fresh fruits and vegetables, whole grains, lean protein, and low-fat dairy products. Limit your intake of foods with high amounts of sugar, saturated fats, and salt. Take vitamin and mineral supplements as recommended by your health care provider. Do not drink alcohol if your health care provider tells you not to drink. If you drink alcohol: Limit how much you have to 0-1 drink a day. Know how much alcohol is in your drink. In the [place] one drink equals one 12 oz bottle of beer (355 mL), one 5 oz glass of wine (148 mL), or one 1 oz glass of hard liquor (44 mL). Lifestyle Brush your teeth every morning and night with fluoride toothpaste. Floss one time each day. Exercise for at least 30 minutes 5 or more days each week. Do not use any products that contain nicotine or tobacco. These products include cigarettes, chewing tobacco, and vaping devices, such as e-cigarettes. If you need help quitting, ask your health care provider. Do not use drugs. If you are sexually active, practice safe sex. Use a condom or other form of protection in order to prevent STIs. Take aspirin only as told by   your health care provider. Make sure that you understand how much to take and what form to take. Work with your health care provider to find out whether it  is safe and beneficial for you to take aspirin daily. Ask your health care provider if you need to take a cholesterol-lowering medicine (statin). Find healthy ways to manage stress, such as: Meditation, yoga, or listening to music. Journaling. Talking to a trusted person. Spending time with friends and family. Minimize exposure to UV radiation to reduce your risk of skin cancer. Safety Always wear your seat belt while driving or riding in a vehicle. Do not drive: If you have been drinking alcohol. Do not ride with someone who has been drinking. When you are tired or distracted. While texting. If you have been using any mind-altering substances or drugs. Wear a helmet and other protective equipment during sports activities. If you have firearms in your house, make sure you follow all gun safety procedures. What's next? Visit your health care provider once a year for an annual wellness visit. Ask your health care provider how often you should have your eyes and teeth checked. Stay up to date on all vaccines. This information is not intended to replace advice given to you by your health care provider. Make sure you discuss any questions you have with your health care provider. Document Revised: 03/22/2021 Document Reviewed: 03/22/2021 Elsevier Patient Education  2024 [employer]  

## 2023-03-07 ENCOUNTER — Other Ambulatory Visit: Payer: Self-pay | Admitting: Family Medicine

## 2023-03-07 DIAGNOSIS — E785 Hyperlipidemia, unspecified: Secondary | ICD-10-CM

## 2023-03-08 ENCOUNTER — Other Ambulatory Visit: Payer: Self-pay | Admitting: *Deleted

## 2023-03-08 MED ORDER — ROSUVASTATIN CALCIUM 10 MG PO TABS: 10.0000 mg | ORAL_TABLET | Freq: Every day | ORAL | 2 refills | Status: DC

## 2023-04-17 ENCOUNTER — Other Ambulatory Visit: Payer: Self-pay | Admitting: Family Medicine

## 2023-07-03 DIAGNOSIS — H524 Presbyopia: Secondary | ICD-10-CM | POA: Diagnosis not present

## 2023-07-03 DIAGNOSIS — H52212 Irregular astigmatism, left eye: Secondary | ICD-10-CM | POA: Diagnosis not present

## 2023-07-12 DIAGNOSIS — M1712 Unilateral primary osteoarthritis, left knee: Secondary | ICD-10-CM | POA: Diagnosis not present

## 2023-08-09 ENCOUNTER — Ambulatory Visit (INDEPENDENT_AMBULATORY_CARE_PROVIDER_SITE_OTHER): Payer: Medicare HMO | Admitting: Family Medicine

## 2023-08-09 ENCOUNTER — Encounter: Payer: Self-pay | Admitting: Family Medicine

## 2023-08-09 ENCOUNTER — Ambulatory Visit (INDEPENDENT_AMBULATORY_CARE_PROVIDER_SITE_OTHER): Payer: Medicare HMO | Admitting: *Deleted

## 2023-08-09 VITALS — BP 138/70 | HR 67 | Temp 97.9°F | Resp 18 | Ht 67.0 in | Wt 135.0 lb

## 2023-08-09 DIAGNOSIS — I10 Essential (primary) hypertension: Secondary | ICD-10-CM | POA: Diagnosis not present

## 2023-08-09 DIAGNOSIS — Z0000 Encounter for general adult medical examination without abnormal findings: Secondary | ICD-10-CM

## 2023-08-09 DIAGNOSIS — E785 Hyperlipidemia, unspecified: Secondary | ICD-10-CM

## 2023-08-09 LAB — COMPREHENSIVE METABOLIC PANEL
ALT: 13 U/L (ref 0–35)
AST: 16 U/L (ref 0–37)
Albumin: 4.4 g/dL (ref 3.5–5.2)
Alkaline Phosphatase: 49 U/L (ref 39–117)
BUN: 15 mg/dL (ref 6–23)
CO2: 27 meq/L (ref 19–32)
Calcium: 9.7 mg/dL (ref 8.4–10.5)
Chloride: 104 meq/L (ref 96–112)
Creatinine, Ser: 0.75 mg/dL (ref 0.40–1.20)
GFR: 77.09 mL/min (ref >60.00)
Glucose, Bld: 91 mg/dL (ref 70–99)
Potassium: 4.4 meq/L (ref 3.5–5.1)
Sodium: 139 meq/L (ref 135–145)
Total Bilirubin: 0.6 mg/dL (ref 0.2–1.2)
Total Protein: 7.2 g/dL (ref 6.0–8.3)

## 2023-08-09 LAB — LIPID PANEL
Cholesterol: 185 mg/dL (ref 0–200)
HDL: 81 mg/dL (ref >39.00)
LDL Cholesterol: 90 mg/dL (ref 0–99)
NonHDL: 103.79
Total CHOL/HDL Ratio: 2
Triglycerides: 67 mg/dL (ref 0.0–149.0)
VLDL: 13.4 mg/dL (ref 0.0–40.0)

## 2023-08-09 NOTE — Progress Notes (Signed)
Subjective:   Tonya Sheppard is a 77 y.o. female who presents for Medicare Annual (Subsequent) preventive examination.  Visit Complete: Virtual I connected with  Tonya Sheppard on 08/09/23 by a audio enabled telemedicine application and verified that I am speaking with the correct person using two identifiers.  Patient Location: Home  Provider Location: Office/Clinic  I discussed the limitations of evaluation and management by telemedicine. The patient expressed understanding and agreed to proceed.  Vital Signs: Because this visit was a virtual/telehealth visit, some criteria may be missing or patient reported. Any vitals not documented were not able to be obtained and vitals that have been documented are patient reported.  Patient Medicare AWV questionnaire was completed by the patient on 08/02/23; I have confirmed that all information answered by patient is correct and no changes since this date.  Cardiac Risk Factors include: advanced age (>76men, >80 women);dyslipidemia;hypertension     Objective:    There were no vitals filed for this visit. There is no height or weight on file to calculate BMI.     08/09/2023    1:03 PM 07/16/2022    1:02 PM 06/21/2021    9:43 AM 10/11/2016   11:36 AM 10/24/2015    8:36 AM 10/11/2015    1:21 PM  Advanced Directives  Does Patient Have a Medical Advance Directive? Yes Yes Yes Yes Yes Yes  Type of Estate agent of [place];Living will Healthcare Power of [place];Living will Healthcare Power of [place];Living will Living will  Healthcare Power of [place];Living will  Does patient want to make changes to medical advance directive? No - Patient declined     No - Patient declined  Copy of Healthcare Power of Attorney in Chart? No - copy requested No - copy requested No - copy requested  No - copy requested No - copy requested    Current Medications (verified) Outpatient Encounter Medications as of 08/09/2023  Medication Sig    lisinopril (ZESTRIL) 10 MG tablet Take 1 tablet (10 mg total) by mouth daily.   meloxicam (MOBIC) 15 MG tablet Take 1 tablet (15 mg total) by mouth daily.   rosuvastatin (CRESTOR) 10 MG tablet Take 1 tablet (10 mg total) by mouth daily.   No facility-administered encounter medications on file as of 08/09/2023.    Allergies (verified) Patient has no known allergies.   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   Cataract    Removed   Hyperlipidemia    Hypertension sometimes   Osteopenia    Osteoporosis    Ostio penia   Past Surgical History:  Procedure Laterality Date   CATARACT EXTRACTION     EYE SURGERY     cateract right eye   TUBAL LIGATION     Family History  Problem Relation Age of Onset   Hypertension Mother    Heart disease Mother    Heart disease Father 59       MI   Cancer Sister 80       ovarian   Hepatitis Brother    Alcohol abuse Brother    Alcohol abuse Sister    Esophageal cancer Sister    Colon cancer Neg Hx    Social History   Socioeconomic History   Marital status: Married    Spouse name: Not on file   Number of children: Not on file   Years of education: Not on file   Highest education level: 12th grade  Occupational  History   Occupation: financial analyst--retired    Employer: [employer]  Tobacco Use   Smoking status: Former   Smokeless tobacco: Never  Substance and Sexual Activity   Alcohol use: Yes    Alcohol/week: 7.0 - 14.0 standard drinks of alcohol   Drug use: Never   Sexual activity: Not Currently    Partners: Male    Birth control/protection: Post-menopausal  Other Topics Concern   Not on file  Social History Narrative   Exercise- [employer], yard work,  [employer] fitness   Social Determinants of Health   Financial Resource Strain: Low Risk  (08/02/2023)   Overall Financial Resource Strain (CARDIA)    Difficulty of Paying Living Expenses: Not hard at all  Food Insecurity: No Food  Insecurity (08/02/2023)   Hunger Vital Sign    Worried About Running Out of Food in the Last Year: Never true    Ran Out of Food in the Last Year: Never true  Transportation Needs: No Transportation Needs (08/02/2023)   PRAPARE - Administrator, Civil Service (Medical): No    Lack of Transportation (Non-Medical): No  Physical Activity: Unknown (08/02/2023)   Exercise Vital Sign    Days of Exercise per Week: 3 days    Minutes of Exercise per Session: Patient declined  Stress: No Stress Concern Present (08/02/2023)   [employer] of Occupational Health - Occupational Stress Questionnaire    Feeling of Stress : Not at all  Social Connections: Socially Integrated (08/02/2023)   Social Connection and Isolation Panel [NHANES]    Frequency of Communication with Friends and Family: More than three times a week    Frequency of Social Gatherings with Friends and Family: More than three times a week    Attends Religious Services: More than 4 times per year    Active Member of [employer] or Organizations: Yes    Attends Engineer, structural: More than 4 times per year    Marital Status: Married    Tobacco Counseling Counseling given: Not Answered   Clinical Intake:  Pre-visit preparation completed: Yes  Pain : No/denies pain  BMI - recorded: 21.14 Nutritional Status: BMI of 19-24  Normal Nutritional Risks: None Diabetes: No  How often do you need to have someone help you when you read instructions, pamphlets, or other written materials from your doctor or pharmacy?: 1 - Never  Interpreter Needed?: No  Information entered by :: Donne Anon, CMA   Activities of Daily Living    08/02/2023    9:29 AM  In your present state of health, do you have any difficulty performing the following activities:  Hearing? 0  Vision? 0  Difficulty concentrating or making decisions? 0  Walking or climbing stairs? 0  Dressing or bathing? 0  Doing errands, shopping? 0   Preparing Food and eating ? N  Using the Toilet? N  In the past six months, have you accidently leaked urine? N  Do you have problems with loss of bowel control? Y  Managing your Medications? N  Managing your Finances? N  Housekeeping or managing your Housekeeping? N    Patient Care Team: Zola Button, Grayling Congress, DO as PCP - General Doristine Section, MD as Consulting Physician (Orthopedic Surgery) Richarda Overlie, MD as Consulting Physician (Obstetrics and Gynecology) Nelson Chimes, MD as Consulting Physician (Ophthalmology)  Indicate any recent Medical Services you may have received from other than [facility] providers in the past year (date may be approximate).  Assessment:   This is a routine wellness examination for Tonya Sheppard.  Hearing/Vision screen No results found.   Goals Addressed   None    Depression Screen    08/09/2023    1:05 PM 03/05/2023    9:08 AM 07/16/2022    1:04 PM 06/21/2021    9:45 AM 11/17/2020    9:42 AM 12/10/2017    2:37 PM 10/11/2016   10:31 AM  PHQ 2/9 Scores  PHQ - 2 Score 0 0 0 0 0 1 0  PHQ- 9 Score      1     Fall Risk    08/02/2023    9:29 AM 03/05/2023    9:08 AM 07/16/2022    1:03 PM 07/15/2022    2:09 PM 06/21/2021    9:44 AM  Fall Risk   Falls in the past year? 0 0 0 0 0  Number falls in past yr: 0 0 0 0 0  Injury with Fall? 0 0 0 0 0  Risk for fall due to : No Fall Risks      Follow up Falls evaluation completed Falls evaluation completed Falls prevention discussed  Falls prevention discussed    MEDICARE RISK AT HOME: Medicare Risk at Home Any stairs in or around the home?: Yes If so, are there any without handrails?: Yes Home free of loose throw rugs in walkways, pet beds, electrical cords, etc?: Yes Adequate lighting in your home to reduce risk of falls?: Yes Life alert?: No Use of a cane, walker or w/c?: No Grab bars in the bathroom?: Yes Shower chair or bench in shower?: No Elevated toilet seat or a handicapped toilet?: No  TIMED  UP AND GO:  Was the test performed?  No    Cognitive Function:    07/16/2022    1:16 PM 10/11/2016   11:39 AM  MMSE - Mini Mental State Exam  Not completed: Refused   Orientation to time  5  Orientation to Place  5  Registration  3  Attention/ Calculation  5  Recall  3  Language- name 2 objects  2  Language- repeat  1  Language- follow 3 step command  3  Language- read & follow direction  1  Write a sentence  1  Copy design  1  Total score  30        08/09/2023    1:07 PM  6CIT Screen  What Year? 0 points  What month? 0 points  What time? 0 points  Count back from 20 0 points  Months in reverse 0 points  Repeat phrase 0 points  Total Score 0 points    Immunizations Immunization History  Administered Date(s) Administered   Fluad Quad(high Dose 65+) 06/21/2023   Influenza Whole 08/16/2010, 07/27/2011   Influenza, High Dose Seasonal PF 10/11/2016, 06/17/2019   Influenza-Unspecified 06/08/2013, 08/19/2018, 06/09/2019, 08/24/2020, 07/08/2021, 09/12/2022   Moderna Sars-Covid-2 Vaccination 11/19/2019, 12/17/2019, 08/08/2020, 01/25/2021, 06/22/2021   Pfizer(Comirnaty)Fall Seasonal Vaccine 12 years and older 06/21/2023   Pneumococcal Conjugate-13 10/11/2016   Pneumococcal Polysaccharide-23 12/24/2013   RSV,unspecified 02/19/2023   Tdap 08/28/2011, 03/05/2023   Zoster Recombinant(Shingrix) 07/28/2018, 09/27/2018   Zoster, Live 06/20/2010    TDAP status: Up to date  Flu Vaccine status: Up to date  Pneumococcal vaccine status: Up to date  Covid-19 vaccine status: Completed vaccines  Qualifies for Shingles Vaccine? Yes   Zostavax completed Yes   Shingrix Completed?: Yes  Screening Tests Health Maintenance  Topic Date Due  Medicare Annual Wellness (AWV)  07/17/2023   COVID-19 Vaccine (7 - 2023-24 season) 10/21/2023   MAMMOGRAM  10/30/2023   DEXA SCAN  09/24/2024   DTaP/Tdap/Td (3 - Td or Tdap) 03/04/2033   Pneumonia Vaccine 57+ Years old  Completed    INFLUENZA VACCINE  Completed   Hepatitis C Screening  Completed   Zoster Vaccines- Shingrix  Completed   HPV VACCINES  Aged Out   Colonoscopy  Discontinued    Health Maintenance  Health Maintenance Due  Topic Date Due   Medicare Annual Wellness (AWV)  07/17/2023    Colorectal cancer screening: No longer required.   Mammogram status: Completed 10/29/22. Repeat every year  Bone Density status: Completed 09/24/22. Results reflect: Bone density results: OSTEOPENIA. Repeat every 2 years.  Lung Cancer Screening: (Low Dose CT Chest recommended if Age 37-80 years, 20 pack-year currently smoking OR have quit w/in 15years.) does not qualify.   Additional Screening:  Hepatitis C Screening: does qualify; Completed 12/05/21  Vision Screening: Recommended annual ophthalmology exams for early detection of glaucoma and other disorders of the eye. Is the patient up to date with their annual eye exam?  Yes  Who is the provider or what is the name of the office in which the patient attends annual eye exams? [facility] Dr. Jimmey Ralph If pt is not established with a provider, would they like to be referred to a provider to establish care? No .   Dental Screening: Recommended annual dental exams for proper oral hygiene  Diabetic Foot Exam: N/a  Community Resource Referral / Chronic Care Management: CRR required this visit?  No   CCM required this visit?  No     Plan:     I have personally reviewed and noted the following in the patient's chart:   Medical and social history Use of alcohol, tobacco or illicit drugs  Current medications and supplements including opioid prescriptions. Patient is not currently taking opioid prescriptions. Functional ability and status Nutritional status Physical activity Advanced directives List of other physicians Hospitalizations, surgeries, and ER visits in previous 12 months Vitals Screenings to include cognitive, depression, and falls Referrals  and appointments  In addition, I have reviewed and discussed with patient certain preventive protocols, quality metrics, and best practice recommendations. A written personalized care plan for preventive services as well as general preventive health recommendations were provided to patient.     Donne Anon, CMA   08/09/2023   After Visit Summary: (MyChart) Due to this being a telephonic visit, the after visit summary with patients personalized plan was offered to patient via MyChart   Nurse Notes: None

## 2023-08-09 NOTE — Progress Notes (Signed)
Established Patient Office Visit  Subjective   Patient ID: Tonya Sheppard, female    DOB: December 03, 1945  Age: 77 y.o. MRN: [id number]  Chief Complaint  Patient presents with   Hypertension   Hyperlipidemia   Follow-up    HPI Discussed the use of AI scribe software for clinical note transcription with the patient, who gave verbal consent to proceed.  History of Present Illness   The patient, with a history of hypertension, presents for a routine check-up. She reports that her blood pressure is slightly elevated today, which she attributes to forgetting to take her morning medication. She denies any symptoms related to hypertension.  Additionally, the patient reports occasional episodes of diarrhea, particularly after eating out. She suspects that certain foods, possibly dairy, may be triggering these episodes. She manages these episodes with over-the-counter Imodium as needed.      Patient Active Problem List   Diagnosis Date Noted   Hyperlipidemia 05/29/2021   Hair loss 05/29/2021   Preventative health care 11/17/2020   Knee pain 06/18/2019   Essential hypertension 04/28/2018   Dysuria 03/01/2016   Past Medical History:  Diagnosis Date   Osteopenia    Past Surgical History:  Procedure Laterality Date   CATARACT EXTRACTION     EYE SURGERY     cateract right eye   TUBAL LIGATION     Social History   Tobacco Use   Smoking status: Never   Smokeless tobacco: Never  Substance Use Topics   Alcohol use: Yes    Alcohol/week: 7.0 - 14.0 standard drinks of alcohol    Types: 7 - 14 Glasses of wine per week   Drug use: No   Social History   Socioeconomic History   Marital status: Married    Spouse name: Not on file   Number of children: Not on file   Years of education: Not on file   Highest education level: 12th grade  Occupational History   Occupation: financial analyst--retired    Employer: [employer]  Tobacco Use   Smoking status: Never    Smokeless tobacco: Never  Substance and Sexual Activity   Alcohol use: Yes    Alcohol/week: 7.0 - 14.0 standard drinks of alcohol    Types: 7 - 14 Glasses of wine per week   Drug use: No   Sexual activity: Yes    Partners: Male  Other Topics Concern   Not on file  Social History Narrative   Exercise- [employer], yard work,  [employer] fitness   Social Determinants of Health   Financial Resource Strain: Low Risk  (08/02/2023)   Overall Financial Resource Strain (CARDIA)    Difficulty of Paying Living Expenses: Not hard at all  Food Insecurity: No Food Insecurity (08/02/2023)   Hunger Vital Sign    Worried About Running Out of Food in the Last Year: Never true    Ran Out of Food in the Last Year: Never true  Transportation Needs: No Transportation Needs (08/02/2023)   PRAPARE - Administrator, Civil Service (Medical): No    Lack of Transportation (Non-Medical): No  Physical Activity: Unknown (08/02/2023)   Exercise Vital Sign    Days of Exercise per Week: 3 days    Minutes of Exercise per Session: Patient declined  Stress: No Stress Concern Present (08/02/2023)   [employer] of Occupational Health - Occupational Stress Questionnaire    Feeling of Stress : Not at all  Social Connections: Socially Integrated (08/02/2023)   Social  Connection and Isolation Panel [NHANES]    Frequency of Communication with Friends and Family: More than three times a week    Frequency of Social Gatherings with Friends and Family: More than three times a week    Attends Religious Services: More than 4 times per year    Active Member of [employer] or Organizations: Yes    Attends Engineer, structural: More than 4 times per year    Marital Status: Married  Catering manager Violence: Not At Risk (07/16/2022)   Humiliation, Afraid, Rape, and Kick questionnaire    Fear of Current or Ex-Partner: No    Emotionally Abused: No    Physically Abused: No    Sexually Abused: No   Family Status   Relation Name Status   Mother  Deceased at age 3   Father  Deceased   Sister  Deceased at age 78   Brother  Deceased   Brother  Deceased   Sister  Deceased at age 21   Brother  Alive   Sister  Alive   Sister  Alive   Sister  Alive   Neg Hx  (Not Specified)  No partnership data on file   Family History  Problem Relation Age of Onset   Hypertension Mother    Heart disease Mother    Heart disease Father 17       MI   Cancer Sister 70       ovarian   Hepatitis Brother    Alcohol abuse Brother    Alcohol abuse Sister    Esophageal cancer Sister    Colon cancer Neg Hx    No Known Allergies    Review of Systems  Constitutional:  Negative for chills, fever and malaise/fatigue.  HENT:  Negative for congestion and hearing loss.   Eyes:  Negative for blurred vision and discharge.  Respiratory:  Negative for cough, sputum production and shortness of breath.   Cardiovascular:  Negative for chest pain, palpitations and leg swelling.  Gastrointestinal:  Negative for abdominal pain, blood in stool, constipation, diarrhea, heartburn, nausea and vomiting.  Genitourinary:  Negative for dysuria, frequency, hematuria and urgency.  Musculoskeletal:  Negative for back pain, falls and myalgias.  Skin:  Negative for rash.  Neurological:  Negative for dizziness, sensory change, loss of consciousness, weakness and headaches.  Endo/Heme/Allergies:  Negative for environmental allergies. Does not bruise/bleed easily.  Psychiatric/Behavioral:  Negative for depression and suicidal ideas. The patient is not nervous/anxious and does not have insomnia.       Objective:     BP 138/70 (BP Location: Right Arm, Patient Position: Sitting, Cuff Size: Normal)   Pulse 67   Temp 97.9 F (36.6 C) (Oral)   Resp 18   Ht 5\' 7"  (1.702 m)   Wt 135 lb (61.2 kg)   SpO2 97%   BMI 21.14 kg/m  BP Readings from Last 3 Encounters:  08/09/23 138/70  03/05/23 (!) 140/90  12/05/21 134/88   Wt Readings from  Last 3 Encounters:  08/09/23 135 lb (61.2 kg)  03/05/23 134 lb (60.8 kg)  07/16/22 132 lb (59.9 kg)   SpO2 Readings from Last 3 Encounters:  08/09/23 97%  03/05/23 96%  12/05/21 98%      Physical Exam Vitals and nursing note reviewed.  Constitutional:      General: She is not in acute distress.    Appearance: Normal appearance. She is well-developed.  HENT:     Head: Normocephalic and atraumatic.  Eyes:  General: No scleral icterus.       Right eye: No discharge.        Left eye: No discharge.  Cardiovascular:     Rate and Rhythm: Normal rate and regular rhythm.     Heart sounds: No murmur heard. Pulmonary:     Effort: Pulmonary effort is normal. No respiratory distress.     Breath sounds: Normal breath sounds.  Musculoskeletal:        General: Normal range of motion.     Cervical back: Normal range of motion and neck supple.     Right lower leg: No edema.     Left lower leg: No edema.  Skin:    General: Skin is warm and dry.  Neurological:     Mental Status: She is alert and oriented to person, place, and time.  Psychiatric:        Mood and Affect: Mood normal.        Behavior: Behavior normal.        Thought Content: Thought content normal.        Judgment: Judgment normal.      No results found for any visits on 08/09/23.  Last CBC Lab Results  Component Value Date   WBC 8.7 03/05/2023   HGB 13.9 03/05/2023   HCT 42.3 03/05/2023   MCV 93.2 03/05/2023   RDW 13.6 03/05/2023   PLT 315.0 03/05/2023   Last metabolic panel Lab Results  Component Value Date   GLUCOSE 93 03/05/2023   NA 138 03/05/2023   K 4.1 03/05/2023   CL 99 03/05/2023   CO2 28 03/05/2023   BUN 13 03/05/2023   CREATININE 0.68 03/05/2023   GFR 84.59 03/05/2023   CALCIUM 9.7 03/05/2023   PROT 7.2 03/05/2023   ALBUMIN 4.2 03/05/2023   BILITOT 0.8 03/05/2023   ALKPHOS 57 03/05/2023   AST 14 03/05/2023   ALT 12 03/05/2023   Last lipids Lab Results  Component Value Date    CHOL 275 (H) 03/05/2023   HDL 79.60 03/05/2023   LDLCALC 169 (H) 03/05/2023   LDLDIRECT 164.1 08/28/2011   TRIG 134.0 03/05/2023   CHOLHDL 3 03/05/2023   Last hemoglobin A1c No results found for: "HGBA1C" Last thyroid functions Lab Results  Component Value Date   TSH 2.45 05/29/2021   T4TOTAL 8.6 05/29/2021   Last vitamin D No results found for: "25OHVITD2", "25OHVITD3", "VD25OH" Last vitamin B12 and Folate No results found for: "VITAMINB12", "FOLATE"    The 10-year ASCVD risk score (Arnett DK, et al., 2019) is: 27.4%    Assessment & Plan:   Problem List Items Addressed This Visit       Unprioritized   Hyperlipidemia - Primary   Relevant Orders   Comprehensive metabolic panel   Lipid panel   Essential hypertension   Relevant Orders   Comprehensive metabolic panel   Lipid panel  Assessment and Plan    Hypertension Blood pressure slightly elevated today, possibly due to missed morning medication. -Continue current antihypertensive regimen. -Encouraged to take medication consistently.  Possible Lactose Intolerance Reports of occasional diarrhea, particularly after eating out and consuming dairy products. -Avoid dairy and observe for improvement. -Consider medication to slow down bowel movements if symptoms worsen.  General Health Maintenance -Flu vaccination up to date. -Order routine labs.        No follow-ups on file.    Donato Schultz, DO

## 2023-08-09 NOTE — Assessment & Plan Note (Signed)
Well controlled, no changes to meds. Encouraged heart healthy diet such as the DASH diet and exercise as tolerated.  °

## 2023-08-09 NOTE — Assessment & Plan Note (Signed)
Encourage heart healthy diet such as MIND or DASH diet, increase exercise, avoid trans fats, simple carbohydrates and processed foods, consider a krill or fish or flaxseed oil cap daily.  °

## 2023-08-09 NOTE — Patient Instructions (Signed)
Tonya Sheppard , Thank you for taking time to come for your Medicare Wellness Visit. I appreciate your ongoing commitment to your health goals. Please review the following plan we discussed and let me know if I can assist you in the future.     This is a list of the screening recommended for you and due dates:  Health Maintenance  Topic Date Due   COVID-19 Vaccine (7 - 2023-24 season) 10/21/2023   Mammogram  10/30/2023   Medicare Annual Wellness Visit  08/08/2024   DEXA scan (bone density measurement)  09/24/2024   DTaP/Tdap/Td vaccine (3 - Td or Tdap) 03/04/2033   Pneumonia Vaccine  Completed   Flu Shot  Completed   Hepatitis C Screening  Completed   Zoster (Shingles) Vaccine  Completed   HPV Vaccine  Aged Out   Colon Cancer Screening  Discontinued    Next appointment: Follow up in one year for your annual wellness visit.   Preventive Care 24 Years and Older, Female Preventive care refers to lifestyle choices and visits with your health care provider that can promote health and wellness. What does preventive care include? A yearly physical exam. This is also called an annual well check. Dental exams once or twice a year. Routine eye exams. Ask your health care provider how often you should have your eyes checked. Personal lifestyle choices, including: Daily care of your teeth and gums. Regular physical activity. Eating a healthy diet. Avoiding tobacco and drug use. Limiting alcohol use. Practicing safe sex. Taking low-dose aspirin every day. Taking vitamin and mineral supplements as recommended by your health care provider. What happens during an annual well check? The services and screenings done by your health care provider during your annual well check will depend on your age, overall health, lifestyle risk factors, and family history of disease. Counseling  Your health care provider may ask you questions about your: Alcohol use. Tobacco use. Drug use. Emotional  well-being. Home and relationship well-being. Sexual activity. Eating habits. History of falls. Memory and ability to understand (cognition). Work and work Astronomer. Reproductive health. Screening  You may have the following tests or measurements: Height, weight, and BMI. Blood pressure. Lipid and cholesterol levels. These may be checked every 5 years, or more frequently if you are over 46 years old. Skin check. Lung cancer screening. You may have this screening every year starting at age 9 if you have a 30-pack-year history of smoking and currently smoke or have quit within the past 15 years. Fecal occult blood test (FOBT) of the stool. You may have this test every year starting at age 22. Flexible sigmoidoscopy or colonoscopy. You may have a sigmoidoscopy every 5 years or a colonoscopy every 10 years starting at age 58. Hepatitis C blood test. Hepatitis B blood test. Sexually transmitted disease (STD) testing. Diabetes screening. This is done by checking your blood sugar (glucose) after you have not eaten for a while (fasting). You may have this done every 1-3 years. Bone density scan. This is done to screen for osteoporosis. You may have this done starting at age 63. Mammogram. This may be done every 1-2 years. Talk to your health care provider about how often you should have regular mammograms. Talk with your health care provider about your test results, treatment options, and if necessary, the need for more tests. Vaccines  Your health care provider may recommend certain vaccines, such as: Influenza vaccine. This is recommended every year. Tetanus, diphtheria, and acellular pertussis (Tdap, Td) vaccine.  You may need a Td booster every 10 years. Zoster vaccine. You may need this after age 74. Pneumococcal 13-valent conjugate (PCV13) vaccine. One dose is recommended after age 31. Pneumococcal polysaccharide (PPSV23) vaccine. One dose is recommended after age 19. Talk to your  health care provider about which screenings and vaccines you need and how often you need them. This information is not intended to replace advice given to you by your health care provider. Make sure you discuss any questions you have with your health care provider. Document Released: 10/21/2015 Document Revised: 06/13/2016 Document Reviewed: 07/26/2015 Elsevier Interactive Patient Education  2017 [employer].  Fall Prevention in the Home Falls can cause injuries. They can happen to people of all ages. There are many things you can do to make your home safe and to help prevent falls. What can I do on the outside of my home? Regularly fix the edges of walkways and driveways and fix any cracks. Remove anything that might make you trip as you walk through a door, such as a raised step or threshold. Trim any bushes or trees on the path to your home. Use bright outdoor lighting. Clear any walking paths of anything that might make someone trip, such as rocks or tools. Regularly check to see if handrails are loose or broken. Make sure that both sides of any steps have handrails. Any raised decks and porches should have guardrails on the edges. Have any leaves, snow, or ice cleared regularly. Use sand or salt on walking paths during winter. Clean up any spills in your garage right away. This includes oil or grease spills. What can I do in the bathroom? Use night lights. Install grab bars by the toilet and in the tub and shower. Do not use towel bars as grab bars. Use non-skid mats or decals in the tub or shower. If you need to sit down in the shower, use a plastic, non-slip stool. Keep the floor dry. Clean up any water that spills on the floor as soon as it happens. Remove soap buildup in the tub or shower regularly. Attach bath mats securely with double-sided non-slip rug tape. Do not have throw rugs and other things on the floor that can make you trip. What can I do in the bedroom? Use night  lights. Make sure that you have a light by your bed that is easy to reach. Do not use any sheets or blankets that are too big for your bed. They should not hang down onto the floor. Have a firm chair that has side arms. You can use this for support while you get dressed. Do not have throw rugs and other things on the floor that can make you trip. What can I do in the kitchen? Clean up any spills right away. Avoid walking on wet floors. Keep items that you use a lot in easy-to-reach places. If you need to reach something above you, use a strong step stool that has a grab bar. Keep electrical cords out of the way. Do not use floor polish or wax that makes floors slippery. If you must use wax, use non-skid floor wax. Do not have throw rugs and other things on the floor that can make you trip. What can I do with my stairs? Do not leave any items on the stairs. Make sure that there are handrails on both sides of the stairs and use them. Fix handrails that are broken or loose. Make sure that handrails are as long as  the stairways. Check any carpeting to make sure that it is firmly attached to the stairs. Fix any carpet that is loose or worn. Avoid having throw rugs at the top or bottom of the stairs. If you do have throw rugs, attach them to the floor with carpet tape. Make sure that you have a light switch at the top of the stairs and the bottom of the stairs. If you do not have them, ask someone to add them for you. What else can I do to help prevent falls? Wear shoes that: Do not have high heels. Have rubber bottoms. Are comfortable and fit you well. Are closed at the toe. Do not wear sandals. If you use a stepladder: Make sure that it is fully opened. Do not climb a closed stepladder. Make sure that both sides of the stepladder are locked into place. Ask someone to hold it for you, if possible. Clearly mark and make sure that you can see: Any grab bars or handrails. First and last  steps. Where the edge of each step is. Use tools that help you move around (mobility aids) if they are needed. These include: Canes. Walkers. Scooters. Crutches. Turn on the lights when you go into a dark area. Replace any light bulbs as soon as they burn out. Set up your furniture so you have a clear path. Avoid moving your furniture around. If any of your floors are uneven, fix them. If there are any pets around you, be aware of where they are. Review your medicines with your doctor. Some medicines can make you feel dizzy. This can increase your chance of falling. Ask your doctor what other things that you can do to help prevent falls. This information is not intended to replace advice given to you by your health care provider. Make sure you discuss any questions you have with your health care provider. Document Released: 07/21/2009 Document Revised: 03/01/2016 Document Reviewed: 10/29/2014 Elsevier Interactive Patient Education  2017 [employer].

## 2023-08-09 NOTE — Addendum Note (Signed)
Addended by: Seabron Spates R on: 08/09/2023 04:15 PM   Modules accepted: Level of Service

## 2023-09-02 ENCOUNTER — Other Ambulatory Visit: Payer: Self-pay | Admitting: Family Medicine

## 2023-10-17 DIAGNOSIS — Z6821 Body mass index (BMI) 21.0-21.9, adult: Secondary | ICD-10-CM | POA: Diagnosis not present

## 2023-10-17 DIAGNOSIS — Z01419 Encounter for gynecological examination (general) (routine) without abnormal findings: Secondary | ICD-10-CM | POA: Diagnosis not present

## 2023-11-04 ENCOUNTER — Other Ambulatory Visit: Payer: Self-pay | Admitting: Family Medicine

## 2023-11-04 DIAGNOSIS — M25569 Pain in unspecified knee: Secondary | ICD-10-CM

## 2023-11-04 DIAGNOSIS — Z1231 Encounter for screening mammogram for malignant neoplasm of breast: Secondary | ICD-10-CM | POA: Diagnosis not present

## 2023-11-04 LAB — HM MAMMOGRAPHY

## 2024-02-06 ENCOUNTER — Ambulatory Visit: Payer: Medicare HMO | Admitting: Family Medicine

## 2024-02-10 ENCOUNTER — Ambulatory Visit (INDEPENDENT_AMBULATORY_CARE_PROVIDER_SITE_OTHER): Admitting: Family Medicine

## 2024-02-10 ENCOUNTER — Encounter: Payer: Self-pay | Admitting: Family Medicine

## 2024-02-10 VITALS — BP 138/86 | HR 93 | Temp 97.6°F | Resp 12 | Ht 67.0 in | Wt 135.2 lb

## 2024-02-10 DIAGNOSIS — I10 Essential (primary) hypertension: Secondary | ICD-10-CM

## 2024-02-10 DIAGNOSIS — E785 Hyperlipidemia, unspecified: Secondary | ICD-10-CM | POA: Diagnosis not present

## 2024-02-10 LAB — TSH: TSH: 2.11 u[IU]/mL (ref 0.35–5.50)

## 2024-02-10 LAB — COMPREHENSIVE METABOLIC PANEL WITH GFR
ALT: 15 U/L (ref 0–35)
AST: 16 U/L (ref 0–37)
Albumin: 4.3 g/dL (ref 3.5–5.2)
Alkaline Phosphatase: 58 U/L (ref 39–117)
BUN: 17 mg/dL (ref 6–23)
CO2: 28 meq/L (ref 19–32)
Calcium: 9.7 mg/dL (ref 8.4–10.5)
Chloride: 103 meq/L (ref 96–112)
Creatinine, Ser: 0.68 mg/dL (ref 0.40–1.20)
GFR: 84.03 mL/min (ref >60.00)
Glucose, Bld: 93 mg/dL (ref 70–99)
Potassium: 4.2 meq/L (ref 3.5–5.1)
Sodium: 140 meq/L (ref 135–145)
Total Bilirubin: 0.6 mg/dL (ref 0.2–1.2)
Total Protein: 7 g/dL (ref 6.0–8.3)

## 2024-02-10 LAB — CBC WITH DIFFERENTIAL/PLATELET
Basophils Absolute: 0 10*3/uL (ref 0.0–0.1)
Basophils Relative: 0.6 % (ref 0.0–3.0)
Eosinophils Absolute: 0.1 10*3/uL (ref 0.0–0.7)
Eosinophils Relative: 1.6 % (ref 0.0–5.0)
HCT: 41.1 % (ref 36.0–46.0)
Hemoglobin: 13.5 g/dL (ref 12.0–15.0)
Lymphocytes Relative: 32.1 % (ref 12.0–46.0)
Lymphs Abs: 2.3 10*3/uL (ref 0.7–4.0)
MCHC: 32.9 g/dL (ref 30.0–36.0)
MCV: 94.2 fl (ref 78.0–100.0)
Monocytes Absolute: 0.6 10*3/uL (ref 0.1–1.0)
Monocytes Relative: 8.8 % (ref 3.0–12.0)
Neutro Abs: 4.1 10*3/uL (ref 1.4–7.7)
Neutrophils Relative %: 56.9 % (ref 43.0–77.0)
Platelets: 304 10*3/uL (ref 150.0–400.0)
RBC: 4.37 Mil/uL (ref 3.87–5.11)
RDW: 13.7 % (ref 11.5–15.5)
WBC: 7.2 10*3/uL (ref 4.0–10.5)

## 2024-02-10 LAB — LIPID PANEL
Cholesterol: 190 mg/dL (ref 0–200)
HDL: 77.5 mg/dL (ref >39.00)
LDL Cholesterol: 99 mg/dL (ref 0–99)
NonHDL: 112.27
Total CHOL/HDL Ratio: 2
Triglycerides: 68 mg/dL (ref 0.0–149.0)
VLDL: 13.6 mg/dL (ref 0.0–40.0)

## 2024-02-10 MED ORDER — LISINOPRIL 20 MG PO TABS: 20.0000 mg | ORAL_TABLET | Freq: Every day | ORAL | 3 refills | Status: AC

## 2024-02-10 MED ORDER — LISINOPRIL 10 MG PO TABS: 10.0000 mg | ORAL_TABLET | Freq: Every day | ORAL | 1 refills | Status: DC

## 2024-02-10 MED ORDER — ROSUVASTATIN CALCIUM 10 MG PO TABS
10.0000 mg | ORAL_TABLET | Freq: Every day | ORAL | 1 refills | Status: AC
Start: 2024-02-10 — End: ?

## 2024-02-10 NOTE — Assessment & Plan Note (Signed)
 Well controlled, no changes to meds. Encouraged heart healthy diet such as the DASH diet and exercise as tolerated.

## 2024-02-10 NOTE — Assessment & Plan Note (Signed)
 Tolerating statin, encouraged heart healthy diet, avoid trans fats, minimize simple carbs and saturated fats. Increase exercise as tolerated

## 2024-02-10 NOTE — Progress Notes (Signed)
 Established Patient Office Visit  Subjective   Patient ID: Tonya Sheppard, female    DOB: 1946/07/29  Age: 78 y.o. MRN: [id number]  Chief Complaint  Patient presents with   6 month follow up    HPI Discussed the use of AI scribe software for clinical note transcription with the patient, who gave verbal consent to proceed.  History of Present Illness Tonya Sheppard is a 78 year old female with hypertension who presents with concerns about elevated blood pressure readings at home.  She has been experiencing inconsistent blood pressure readings at home, with recent measurements ranging from 120/80 mmHg to 147/90 mmHg. She describes the higher readings as 'borderline' and is concerned about the variability.  She sometimes feels lightheaded, particularly when her blood pressure is elevated, and is worried about the risk of falling due to these symptoms. She does not check her blood pressure daily, which may contribute to the inconsistency in her readings.  She uses an automatic blood pressure cuff at home and questions whether the tightness of the cuff might be affecting her readings. Her current medication regimen includes lisinopril , which she takes regularly.   Patient Active Problem List   Diagnosis Date Noted   Hyperlipidemia 05/29/2021   Hair loss 05/29/2021   Preventative health care 11/17/2020   Knee pain 06/18/2019   Essential hypertension 04/28/2018   Dysuria 03/01/2016   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   Cataract    Removed   Hyperlipidemia    Hypertension sometimes   Osteopenia    Osteoporosis    Ostio penia   Past Surgical History:  Procedure Laterality Date   CATARACT EXTRACTION     EYE SURGERY     cateract right eye   TUBAL LIGATION     Social History   Tobacco Use   Smoking status: Former   Smokeless tobacco: Never  Substance Use Topics   Alcohol use: Yes    Alcohol/week: 7.0 - 14.0 standard drinks of alcohol    Drug use: Never   Social History   Socioeconomic History   Marital status: Married    Spouse name: Not on file   Number of children: Not on file   Years of education: Not on file   Highest education level: 12th grade  Occupational History   Occupation: financial analyst--retired    Employer: [employer]  Tobacco Use   Smoking status: Former   Smokeless tobacco: Never  Substance and Sexual Activity   Alcohol use: Yes    Alcohol/week: 7.0 - 14.0 standard drinks of alcohol   Drug use: Never   Sexual activity: Not Currently    Partners: Male    Birth control/protection: Post-menopausal  Other Topics Concern   Not on file  Social History Narrative   Exercise- [employer], yard work,  [employer]   Social Drivers of Health   Financial Resource Strain: Low Risk  (02/03/2024)   Overall Financial Resource Strain (CARDIA)    Difficulty of Paying Living Expenses: Not hard at all  Food Insecurity: No Food Insecurity (02/03/2024)   Hunger Vital Sign    Worried About Running Out of Food in the Last Year: Never true    Ran Out of Food in the Last Year: Never true  Transportation Needs: No Transportation Needs (02/03/2024)   PRAPARE - Administrator, Civil Service (Medical): No    Lack of Transportation (Non-Medical): No  Physical Activity: Insufficiently Active (02/03/2024)  Exercise Vital Sign    Days of Exercise per Week: 3 days    Minutes of Exercise per Session: 40 min  Stress: No Stress Concern Present (02/03/2024)   [employer] of Occupational Health - Occupational Stress Questionnaire    Feeling of Stress : Not at all  Social Connections: Socially Integrated (02/03/2024)   Social Connection and Isolation Panel [NHANES]    Frequency of Communication with Friends and Family: More than three times a week    Frequency of Social Gatherings with Friends and Family: More than three times a week    Attends Religious Services: More than 4 times per year    Active  Member of [employer] or Organizations: Yes    Attends Banker Meetings: More than 4 times per year    Marital Status: Married  Catering manager Violence: Not At Risk (08/09/2023)   Humiliation, Afraid, Rape, and Kick questionnaire    Fear of Current or Ex-Partner: No    Emotionally Abused: No    Physically Abused: No    Sexually Abused: No   Family Status  Relation Name Status   Mother Chere Cordon Deceased at age 55   Father Mollie Anger Buysse Deceased   Sister Barb Deceased at age 38   Brother  Deceased   Brother Emmitt Harp Deceased   Sister Kellie Patience Deceased at age 4   Brother  Alive   Sister  Alive   Sister  Alive   Sister  Alive   Neg Hx  (Not Specified)  No partnership data on file   Family History  Problem Relation Age of Onset   Hypertension Mother    Heart disease Mother    Heart disease Father 18       MI   Cancer Sister 41       ovarian   Hepatitis Brother    Alcohol abuse Brother    Alcohol abuse Sister    Esophageal cancer Sister    Colon cancer Neg Hx    No Known Allergies    Review of Systems  Constitutional:  Negative for chills, fever and malaise/fatigue.  HENT:  Negative for congestion and hearing loss.   Eyes:  Negative for blurred vision and discharge.  Respiratory:  Negative for cough, sputum production and shortness of breath.   Cardiovascular:  Negative for chest pain, palpitations and leg swelling.  Gastrointestinal:  Negative for abdominal pain, blood in stool, constipation, diarrhea, heartburn, nausea and vomiting.  Genitourinary:  Negative for dysuria, frequency, hematuria and urgency.  Musculoskeletal:  Negative for back pain, falls and myalgias.  Skin:  Negative for rash.  Neurological:  Negative for dizziness, sensory change, loss of consciousness, weakness and headaches.  Endo/Heme/Allergies:  Negative for environmental allergies. Does not bruise/bleed easily.  Psychiatric/Behavioral:  Negative for depression and  suicidal ideas. The patient is not nervous/anxious and does not have insomnia.       Objective:     BP 138/86 (BP Location: Left Arm, Patient Position: Sitting, Cuff Size: Normal)   Pulse 93   Temp 97.6 F (36.4 C) (Oral)   Resp 12   Ht 5\' 7"  (1.702 m)   Wt 135 lb 3.2 oz (61.3 kg)   SpO2 93%   BMI 21.18 kg/m  BP Readings from Last 3 Encounters:  02/10/24 138/86  08/09/23 138/70  03/05/23 (!) 140/90   Wt Readings from Last 3 Encounters:  02/10/24 135 lb 3.2 oz (61.3 kg)  08/09/23 135 lb (61.2 kg)  03/05/23 134  lb (60.8 kg)   SpO2 Readings from Last 3 Encounters:  02/10/24 93%  08/09/23 97%  03/05/23 96%      Physical Exam Vitals and nursing note reviewed.  Constitutional:      General: She is not in acute distress.    Appearance: Normal appearance. She is well-developed.  HENT:     Head: Normocephalic and atraumatic.  Eyes:     General: No scleral icterus.       Right eye: No discharge.        Left eye: No discharge.  Cardiovascular:     Rate and Rhythm: Normal rate and regular rhythm.     Heart sounds: No murmur heard. Pulmonary:     Effort: Pulmonary effort is normal. No respiratory distress.     Breath sounds: Normal breath sounds.  Musculoskeletal:        General: Normal range of motion.     Cervical back: Normal range of motion and neck supple.     Right lower leg: No edema.     Left lower leg: No edema.  Skin:    General: Skin is warm and dry.  Neurological:     Mental Status: She is alert and oriented to person, place, and time.  Psychiatric:        Mood and Affect: Mood normal.        Behavior: Behavior normal.        Thought Content: Thought content normal.        Judgment: Judgment normal.      No results found for any visits on 02/10/24.  Last CBC Lab Results  Component Value Date   WBC 8.7 03/05/2023   HGB 13.9 03/05/2023   HCT 42.3 03/05/2023   MCV 93.2 03/05/2023   RDW 13.6 03/05/2023   PLT 315.0 03/05/2023   Last metabolic  panel Lab Results  Component Value Date   GLUCOSE 91 08/09/2023   NA 139 08/09/2023   K 4.4 08/09/2023   CL 104 08/09/2023   CO2 27 08/09/2023   BUN 15 08/09/2023   CREATININE 0.75 08/09/2023   GFR 77.09 08/09/2023   CALCIUM  9.7 08/09/2023   PROT 7.2 08/09/2023   ALBUMIN 4.4 08/09/2023   BILITOT 0.6 08/09/2023   ALKPHOS 49 08/09/2023   AST 16 08/09/2023   ALT 13 08/09/2023   Last lipids Lab Results  Component Value Date   CHOL 185 08/09/2023   HDL 81.00 08/09/2023   LDLCALC 90 08/09/2023   LDLDIRECT 164.1 08/28/2011   TRIG 67.0 08/09/2023   CHOLHDL 2 08/09/2023   Last hemoglobin A1c No results found for: "HGBA1C" Last thyroid  functions Lab Results  Component Value Date   TSH 2.45 05/29/2021   T4TOTAL 8.6 05/29/2021   Last vitamin D  No results found for: "25OHVITD2", "25OHVITD3", "VD25OH" Last vitamin B12 and Folate No results found for: "VITAMINB12", "FOLATE"    The 10-year ASCVD risk score (Arnett DK, et al., 2019) is: 30.1%    Assessment & Plan:   Problem List Items Addressed This Visit       Unprioritized   Hyperlipidemia - Primary   Tolerating statin, encouraged heart healthy diet, avoid trans fats, minimize simple carbs and saturated fats. Increase exercise as tolerated       Relevant Medications   rosuvastatin  (CRESTOR ) 10 MG tablet   lisinopril  (ZESTRIL ) 20 MG tablet   Other Relevant Orders   CBC with Differential/Platelet   Comprehensive metabolic panel with GFR   Lipid panel   TSH  Essential hypertension   Well controlled, no changes to meds. Encouraged heart healthy diet such as the DASH diet and exercise as tolerated.        Relevant Medications   rosuvastatin  (CRESTOR ) 10 MG tablet   lisinopril  (ZESTRIL ) 20 MG tablet   Other Relevant Orders   CBC with Differential/Platelet   Comprehensive metabolic panel with GFR   Lipid panel   TSH  Assessment and Plan Assessment & Plan Hypertension   Her blood pressure readings fluctuate  between 120/80 mmHg and 147/90 mmHg, with occasional lightheadedness possibly linked to elevated levels. Current lisinopril  10 mg is insufficient to maintain the target of 120/80 mmHg. Increase lisinopril  to 20 mg daily for better control. Monitor blood pressure at least once daily for two weeks. Schedule a nurse visit in two weeks for a blood pressure check and bring the home blood pressure cuff for calibration. If 20 mg lisinopril  is not tolerated, consider adding a diuretic.    Return in about 6 months (around 08/12/2024), or if symptoms worsen or fail to improve, for annual exam, fasting----2 weeks for NV bp check.    Royer Cristobal R Lowne Chase, DO

## 2024-02-10 NOTE — Patient Instructions (Signed)

## 2024-02-11 ENCOUNTER — Encounter: Payer: Self-pay | Admitting: *Deleted

## 2024-02-12 ENCOUNTER — Encounter: Payer: Self-pay | Admitting: Family Medicine

## 2024-02-24 ENCOUNTER — Ambulatory Visit (INDEPENDENT_AMBULATORY_CARE_PROVIDER_SITE_OTHER)

## 2024-02-24 DIAGNOSIS — I10 Essential (primary) hypertension: Secondary | ICD-10-CM

## 2024-02-24 NOTE — Progress Notes (Signed)
 Pt here for Blood pressure check per   Pt currently takes: Lisinopril  40 mg (2 20 mg tablets)   Pt reports compliance with medication.  BP today @ = 126/74 L arm R arm 142/88 ( w pts machine)  HR = 80  Pt advised per DOD Dr. Gwenette Lennox to continue with 2 tablets daily (40 mg of lisinopril  daily) as told by PCP and buy a new BP cuff for at home use.

## 2024-02-26 ENCOUNTER — Ambulatory Visit: Payer: Self-pay | Admitting: Family Medicine

## 2024-02-26 LAB — BASIC METABOLIC PANEL WITH GFR
BUN: 15 mg/dL (ref 6–23)
CO2: 23 meq/L (ref 19–32)
Calcium: 9.7 mg/dL (ref 8.4–10.5)
Chloride: 105 meq/L (ref 96–112)
Creatinine, Ser: 0.73 mg/dL (ref 0.40–1.20)
GFR: 79.33 mL/min (ref >60.00)
Glucose, Bld: 105 mg/dL — ABNORMAL HIGH (ref 70–99)
Potassium: 3.8 meq/L (ref 3.5–5.1)
Sodium: 140 meq/L (ref 135–145)

## 2024-03-03 ENCOUNTER — Ambulatory Visit

## 2024-06-15 ENCOUNTER — Telehealth: Payer: Self-pay

## 2024-06-15 NOTE — Telephone Encounter (Signed)
 Pt made aware there is a wait for prescription vaccines.

## 2024-06-15 NOTE — Telephone Encounter (Signed)
 Copied from CRM [id number]. Topic: Clinical - Medication Question >> Jun 12, 2024  1:31 PM [place] J wrote: Reason for CRM: Patient is wondering if Dr Antonio could send in a script for the COVID vaccination at:   [facility] [id number] - [place], [place] - [address] [place] [address] Phone: [phone] Fax: [phone] Hours: Not open 24 hours  Please call with an update.

## 2024-07-01 NOTE — Progress Notes (Signed)
 Tonya Sheppard                                          MRN: [id number]   07/01/2024   The [facility] Quality Team Specialist reviewed this patient medical record for the purposes of chart review for care gap closure. The following were reviewed: abstraction for care gap closure-controlling blood pressure.    [facility]BCI Quality Team

## 2024-07-08 ENCOUNTER — Telehealth: Payer: Self-pay | Admitting: Family Medicine

## 2024-07-08 NOTE — Telephone Encounter (Signed)
 Copied from CRM #[id number]. Topic: Medicare AWV >> Jul 08, 2024  2:39 PM Nathanel DEL wrote: Reason for CRM: Called LVM 07/08/2024 to schedule AWV. Please schedule office or virtual visits.  Nathanel Paschal; Care Guide Ambulatory Clinical Support [place] [facility] Direct Dial: [phone]

## 2024-08-03 DIAGNOSIS — H26491 Other secondary cataract, right eye: Secondary | ICD-10-CM | POA: Diagnosis not present

## 2024-08-03 DIAGNOSIS — H52212 Irregular astigmatism, left eye: Secondary | ICD-10-CM | POA: Diagnosis not present

## 2024-08-03 DIAGNOSIS — H43813 Vitreous degeneration, bilateral: Secondary | ICD-10-CM | POA: Diagnosis not present

## 2024-08-03 DIAGNOSIS — H524 Presbyopia: Secondary | ICD-10-CM | POA: Diagnosis not present

## 2024-08-03 DIAGNOSIS — Z961 Presence of intraocular lens: Secondary | ICD-10-CM | POA: Diagnosis not present

## 2024-08-14 ENCOUNTER — Encounter: Payer: Self-pay | Admitting: Family Medicine

## 2024-08-14 ENCOUNTER — Ambulatory Visit: Admitting: Family Medicine

## 2024-08-14 ENCOUNTER — Ambulatory Visit (INDEPENDENT_AMBULATORY_CARE_PROVIDER_SITE_OTHER): Admitting: *Deleted

## 2024-08-14 VITALS — BP 129/67 | HR 65 | Temp 97.9°F | Resp 16 | Ht 67.0 in | Wt 136.2 lb

## 2024-08-14 DIAGNOSIS — Z0000 Encounter for general adult medical examination without abnormal findings: Secondary | ICD-10-CM | POA: Diagnosis not present

## 2024-08-14 DIAGNOSIS — R079 Chest pain, unspecified: Secondary | ICD-10-CM

## 2024-08-14 NOTE — Patient Instructions (Addendum)
 Ms. Rehmann,  Thank you for taking the time for your Medicare Wellness Visit. I appreciate your continued commitment to your health goals. Please review the care plan we discussed, and feel free to reach out if I can assist you further.  Please note that Annual Wellness Visits do not include a physical exam. Some assessments may be limited, especially if the visit was conducted virtually. If needed, we may recommend an in-person follow-up with your provider.  Ongoing Care Seeing your primary care provider every 3 to 6 months helps us  monitor your health and provide consistent, personalized care.   Dr Antonio:  today  Annual Wellness Visit: 08/17/25 3pm  Recommended Screenings:  Health Maintenance  Topic Date Due   DEXA scan (bone density measurement)  09/24/2024   Breast Cancer Screening  11/03/2024   COVID-19 Vaccine (8 - 2025-26 season) 12/24/2024   Medicare Annual Wellness Visit  08/14/2025   DTaP/Tdap/Td vaccine (3 - Td or Tdap) 03/04/2033   Pneumococcal Vaccine for age over 54  Completed   Flu Shot  Completed   Hepatitis C Screening  Completed   Zoster (Shingles) Vaccine  Completed   Meningitis B Vaccine  Aged Out   Colon Cancer Screening  Discontinued       08/14/2024    8:23 AM  Advanced Directives  Does Patient Have a Medical Advance Directive? Yes  Type of Estate Agent of [place];Living will  Does patient want to make changes to medical advance directive? No - Patient declined  Copy of Healthcare Power of Attorney in Chart? No - copy requested   Bring a copy of your health care power of attorney and living will to the office to be added to your chart at your convenience. You can mail a copy to [facility] [address] [place], [place] [address] or email to [email or web address]   Vision: Annual vision screenings are recommended for early detection of glaucoma, cataracts, and diabetic retinopathy. These exams can also reveal  signs of chronic conditions such as diabetes and high blood pressure.  Dental: Annual dental screenings help detect early signs of oral cancer, gum disease, and other conditions linked to overall health, including heart disease and diabetes.  Please see the attached documents for additional preventive care recommendations.

## 2024-08-14 NOTE — Progress Notes (Unsigned)
 e  Subjective:    Patient ID: Tonya Sheppard, female    DOB: 1945-11-15, 79 y.o.   MRN: [id number]  Chief Complaint  Patient presents with   Annual Exam    HPI Patient is in today for cpe.  Discussed the use of AI scribe software for clinical note transcription with the patient, who gave verbal consent to proceed.  History of Present Illness Tonya Sheppard is a 78 year old female who presents for an annual physical exam.  She is due for a bone density test next month, which is not yet scheduled. She is interested in having blood work done and has not had any coffee in preparation for this.  She is physically active, engaging in activities such as raking leaves and hauling tarps, describing this time of year as busy with such activities.  She recently experienced occasional sharp chest pains lasting a couple of seconds, described as 'kind of like angina, maybe a little twinge.' These episodes are not severe, do not last long, and are not associated with dyspnea. She notes a family history of heart issues, mentioning that her father died of a heart attack at the age of 63 and had heart problems related to rheumatic fever.  She recently visited the eye doctor and has an upcoming dental appointment. She is uncertain about her RSV vaccination status but thinks she might have received it about a year ago.    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   Cataract    Removed   Hyperlipidemia    Hypertension sometimes    Osteopenia    Osteoporosis    Ostio penia    Past Surgical History:  Procedure Laterality Date   CATARACT EXTRACTION     EYE SURGERY     cateract right eye   TUBAL LIGATION      Family History  Problem Relation Age of Onset   Hypertension Mother    Heart disease Mother    Heart disease Father 81       MI   Cancer Sister 55       ovarian   Hepatitis  Brother    Alcohol abuse Brother    Alcohol abuse Sister    Esophageal cancer Sister    Colon cancer Neg Hx     Social History   Socioeconomic History   Marital status: Married    Spouse name: Not on file   Number of children: Not on file   Years of education: Not on file   Highest education level: 12th grade  Occupational History   Occupation: financial analyst--retired    Employer: [employer]  Tobacco Use   Smoking status: Former   Smokeless tobacco: Never  Substance and Sexual Activity   Alcohol use: Yes    Alcohol/week: 7.0 - 14.0 standard drinks of alcohol   Drug use: Never   Sexual activity: Not Currently    Partners: Male    Birth control/protection: Post-menopausal  Other Topics Concern   Not on file  Social History Narrative   Exercise- [employer], yard work,  [employer]   Social Drivers of Health   Financial Resource Strain: Low Risk  (08/13/2024)   Overall Financial Resource Strain (CARDIA)    Difficulty of Paying Living Expenses: Not hard at all  Food Insecurity: No Food Insecurity (08/13/2024)   Hunger Vital Sign    Worried About Running Out of Food in the Last Year: Never true    Ran Out of Food in the Last Year: Never true  Transportation Needs: No Transportation Needs (08/13/2024)   PRAPARE - Administrator, Civil Service (Medical): No    Lack of Transportation (Non-Medical): No  Physical Activity: Insufficiently Active (08/13/2024)   Exercise Vital Sign    Days of Exercise per Week: 2 days    Minutes of Exercise per Session: 30 min  Stress: No Stress Concern Present  (08/14/2024)   [employer] of Occupational Health - Occupational Stress Questionnaire    Feeling of Stress: Only a little  Social Connections: Socially Integrated (08/13/2024)   Social Connection and Isolation Panel    Frequency of Communication with Friends and Family: More than three times a week    Frequency of Social Gatherings with Friends and Family: More than three times a week    Attends Religious Services: More than 4 times per year    Active Member of [employer] or Organizations: Yes    Attends Engineer, Structural: More than 4 times per year    Marital Status: Married  Catering Manager Violence: Not At Risk (08/14/2024)   Humiliation, Afraid, Rape, and Kick questionnaire    Fear of Current or Ex-Partner: No    Emotionally Abused: No    Physically Abused: No    Sexually Abused: No    Outpatient Medications Prior to Visit  Medication Sig Dispense Refill   lisinopril  (ZESTRIL ) 20 MG tablet Take 1 tablet (20 mg total) by mouth daily. 90 tablet 3   meloxicam  (MOBIC ) 15 MG tablet TAKE 1 TABLET BY MOUTH DAILY 90 tablet 1   rosuvastatin  (CRESTOR ) 10 MG tablet Take 1 tablet (10 mg total) by mouth daily. 90 tablet 1   No facility-administered medications prior to visit.    No Known Allergies  Review of Systems  Constitutional:  Negative for fever and malaise/fatigue.  HENT:  Negative for congestion.   Eyes:  Negative for blurred vision.  Respiratory:  Negative for cough and shortness of breath.   Cardiovascular:  Negative for chest pain, palpitations and leg swelling.  Gastrointestinal:  Negative for vomiting.  Musculoskeletal:  Negative for back pain.  Skin:  Negative for rash.  Neurological:  Negative for loss of consciousness and headaches.       Objective:    Physical Exam Vitals and nursing note reviewed.  Constitutional:      General: She is not in acute distress.    Appearance: Normal appearance. She is well-developed.  HENT:     Head: Normocephalic  and atraumatic.  Eyes:     General: No scleral icterus.       Right eye: No discharge.        Left eye: No discharge.  Cardiovascular:     Rate and Rhythm: Normal rate and regular rhythm.     Heart sounds: No murmur heard. Pulmonary:     Effort: Pulmonary effort is normal. No respiratory distress.     Breath sounds: Normal breath sounds.  Musculoskeletal:        General: Normal range of motion.     Cervical back: Normal range of motion and neck supple.     Right lower leg: No edema.     Left lower leg: No edema.  Skin:    General: Skin is warm and dry.  Neurological:     Mental Status: She is alert and oriented to person, place, and time.  Psychiatric:        Mood and Affect: Mood normal.        Behavior: Behavior normal.        Thought Content: Thought content normal.        Judgment: Judgment normal.     There were no vitals taken for this visit. Wt Readings from Last 3 Encounters:  08/14/24 136 lb 3.2 oz (61.8 kg)  02/10/24 135 lb 3.2 oz (61.3 kg)  08/09/23 135 lb (61.2 kg)    Diabetic Foot Exam - Simple   No data filed    Lab Results  Component Value Date   WBC 7.2 02/10/2024   HGB 13.5 02/10/2024   HCT 41.1 02/10/2024   PLT 304.0 02/10/2024   GLUCOSE 105 (H) 02/24/2024   CHOL 190 02/10/2024   TRIG 68.0 02/10/2024   HDL 77.50 02/10/2024   LDLDIRECT 164.1 08/28/2011   LDLCALC 99 02/10/2024   ALT 15 02/10/2024   AST 16 02/10/2024   NA 140 02/24/2024   K 3.8 02/24/2024   CL 105 02/24/2024   CREATININE 0.73 02/24/2024   BUN 15 02/24/2024   CO2 23 02/24/2024   TSH 2.11 02/10/2024    Lab Results  Component Value Date   TSH 2.11 02/10/2024   Lab Results  Component Value Date   WBC 7.2 02/10/2024   HGB 13.5 02/10/2024   HCT 41.1 02/10/2024   MCV 94.2 02/10/2024   PLT 304.0 02/10/2024   Lab Results  Component Value Date   NA 140 02/24/2024   K 3.8 02/24/2024   CO2 23 02/24/2024   GLUCOSE 105 (H) 02/24/2024   BUN 15 02/24/2024   CREATININE  0.73 02/24/2024   BILITOT 0.6 02/10/2024   ALKPHOS 58 02/10/2024   AST 16 02/10/2024   ALT 15 02/10/2024   PROT 7.0 02/10/2024   ALBUMIN 4.3 02/10/2024   CALCIUM  9.7 02/24/2024   GFR 79.33 02/24/2024   Lab Results  Component Value Date   CHOL 190 02/10/2024   Lab Results  Component Value Date   HDL 77.50 02/10/2024   Lab Results  Component Value Date   LDLCALC 99 02/10/2024   Lab Results  Component Value Date   TRIG 68.0 02/10/2024  Lab Results  Component Value Date   CHOLHDL 2 02/10/2024   No results found for: HGBA1C   EKG ---  no change Assessment & Plan:  Chest pain, unspecified type -     EKG 12-Lead -     Lipoprotein A (LPA)   Assessment and Plan Assessment & Plan Adult Wellness Visit   Routine adult wellness visit with no changes in family history or new surgeries. Regular exercise and recent travel discussed. She is up to date with eye and dental check-ups. A bone density test is scheduled for next month. Blood work was performed. She should continue regular exercise and ensure the bone density test is completed next month.  Chest pain   She experiences intermittent sharp chest pain lasting a few seconds, not associated with dyspnea. There is a family history of heart disease, with her father possibly dying from a myocardial infarction at age 65. Differential diagnosis includes angina or other cardiac issues. An EKG was performed and blood work ordered to assess cardiac risk. A CT calcium  score test was discussed as an option for further evaluation, costing $99, to evaluate coronary artery calcification and risk of myocardial infarction or stroke. Higher scores may indicate increased risk and the potential need for a cardiology referral.   Duston Smolenski R Lowne Chase, DO

## 2024-08-14 NOTE — Progress Notes (Signed)
 Subjective:   Tonya Sheppard is a 78 y.o. female who presents for a Medicare Annual Wellness Visit.  Allergies (verified) Patient has no known allergies.   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   Cataract    Removed   Hyperlipidemia    Hypertension sometimes   Osteopenia    Osteoporosis    Ostio penia   Past Surgical History:  Procedure Laterality Date   CATARACT EXTRACTION     EYE SURGERY     cateract right eye   TUBAL LIGATION     Family History  Problem Relation Age of Onset   Hypertension Mother    Heart disease Mother    Heart disease Father 5       MI   Cancer Sister 43       ovarian   Hepatitis Brother    Alcohol abuse Brother    Alcohol abuse Sister    Esophageal cancer Sister    Colon cancer Neg Hx    Social History   Occupational History   Occupation: financial analyst--retired    Employer: [employer]  Tobacco Use   Smoking status: Former   Smokeless tobacco: Never  Substance and Sexual Activity   Alcohol use: Yes    Alcohol/week: 7.0 - 14.0 standard drinks of alcohol   Drug use: Never   Sexual activity: Not Currently    Partners: Male    Birth control/protection: Post-menopausal   Tobacco Counseling Counseling given: Not Answered  SDOH Screenings   Food Insecurity: No Food Insecurity (08/13/2024)  Housing: Low Risk  (08/13/2024)  Transportation Needs: No Transportation Needs (08/13/2024)  Utilities: Not At Risk (08/14/2024)  Alcohol Screen: Low Risk  (08/13/2024)  Depression (PHQ2-9): Low Risk  (08/14/2024)  Financial Resource Strain: Low Risk  (08/13/2024)  Physical Activity: Insufficiently Active (08/13/2024)  Social Connections: Socially Integrated (08/13/2024)  Stress: No Stress Concern Present (08/14/2024)  Tobacco Use: Medium Risk (08/14/2024)  Health Literacy: Adequate Health Literacy (08/09/2023)   Depression Screen    08/14/2024    8:33 AM 02/10/2024   11:03 AM 08/09/2023    1:05 PM  03/05/2023    9:08 AM 07/16/2022    1:04 PM 06/21/2021    9:45 AM 11/17/2020    9:42 AM  PHQ 2/9 Scores  PHQ - 2 Score 1 0 0 0 0 0 0  PHQ- 9 Score 2           Goals Addressed               This Visit's Progress     To continue helping feed the hungry through her [employer] (pt-stated)         Visit info / Clinical Intake: Medicare Wellness Visit Type:: Subsequent Annual Wellness Visit Medicare Wellness Visit Mode:: In-person (required for WTM) Interpreter Needed?: No Pre-visit prep was completed: yes AWV questionnaire completed by patient prior to visit?: yes Date:: 08/13/24 Living arrangements:: lives with spouse/significant other Patient's Overall Health Status Rating: very good Typical amount of pain: none Does pain affect daily life?: no Are you currently prescribed opioids?: no  Dietary Habits and Nutritional Risks How many meals a day?: (!) 1 (Eats 1 full meal and snacks during the day) Eats fruit and vegetables daily?: yes Most meals are obtained by: preparing own meals Diabetic:: no  Functional Status Activities of Daily Living (to include ambulation/medication): Independent Ambulation: Independent Medication Administration: Independent Home Management: Independent Manage your own finances?: yes  Primary transportation is: driving Concerns about vision?: no *vision screening is required for WTM* (up to date with Dr Kennyth / [facility]) Concerns about hearing?: (!) yes (has difficulty hearing certain pitches, declines testing at present) Uses hearing aids?: no  Fall Screening Falls in the past year?: 0 Number of falls in past year: 0 Was there an injury with Fall?: 0 Fall Risk Category Calculator: 0 Patient Fall Risk Level: Low Fall Risk  Fall Risk Patient at Risk for Falls Due to: No Fall Risks Fall risk Follow up: Falls evaluation completed  Home and Transportation Safety: All rugs have non-skid backing?: yes All stairs or steps have railings?:  N/A, no stairs Grab bars in the bathtub or shower?: yes Have non-skid surface in bathtub or shower?: (!) no Good home lighting?: yes Regular seat belt use?: yes Hospital stays in the last year:: no  Cognitive Assessment Difficulty concentrating, remembering, or making decisions? : no Will 6CIT or Mini Cog be Completed: yes What year is it?: 0 points Give patient an address phrase to remember (5 components): [address] [place]  About what time is it?: 0 points Count backwards from 20 to 1: 0 points Say the months of the year in reverse: 0 points Repeat the address phrase from earlier: 2 points  Advance Directives (For Healthcare) Does Patient Have a Medical Advance Directive?: Yes Does patient want to make changes to medical advance directive?: No - Patient declined Type of Advance Directive: Healthcare Power of [place]; Living will Copy of Healthcare Power of Attorney in Chart?: No - copy requested Copy of Living Will in Chart?: No - copy requested  Reviewed/Updated  Reviewed/Updated: All        Objective:    Today's Vitals   08/14/24 0818 08/14/24 0842  BP: (!) 146/81 129/67  Pulse: 65   Resp: 16   Temp: 97.9 F (36.6 C)   TempSrc: Oral   SpO2: 100%   Weight: 136 lb 3.2 oz (61.8 kg)   Height: 5' 7 (1.702 m)    Body mass index is 21.33 kg/m.  Current Medications (verified) Outpatient Encounter Medications as of 08/14/2024  Medication Sig   lisinopril  (ZESTRIL ) 20 MG tablet Take 1 tablet (20 mg total) by mouth daily.   meloxicam  (MOBIC ) 15 MG tablet TAKE 1 TABLET BY MOUTH DAILY   rosuvastatin  (CRESTOR ) 10 MG tablet Take 1 tablet (10 mg total) by mouth daily.   No facility-administered encounter medications on file as of 08/14/2024.   Hearing/Vision screen No results found. Immunizations and Health Maintenance Health Maintenance  Topic Date Due   DEXA SCAN  09/24/2024   Mammogram  11/03/2024   COVID-19 Vaccine (8 - 2025-26 season) 12/24/2024    Medicare Annual Wellness (AWV)  08/14/2025   DTaP/Tdap/Td (3 - Td or Tdap) 03/04/2033   Pneumococcal Vaccine: 50+ Years  Completed   Influenza Vaccine  Completed   Hepatitis C Screening  Completed   Zoster Vaccines- Shingrix  Completed   Meningococcal B Vaccine  Aged Out   Colonoscopy  Discontinued  Has mammogram and DEXA scheduled for January 2026. All other HM up to date.     Assessment/Plan:  This is a routine wellness examination for [place].  Patient Care Team: Antonio Meth, Jamee SAUNDERS, DO as PCP - General Deward Specking, MD as Consulting Physician (Orthopedic Surgery) Johnnye Ade, MD as Consulting Physician (Obstetrics and Gynecology) Camillo Golas, MD as Consulting Physician (Ophthalmology) Mammography, [facility] (Diagnostic Radiology)  I have personally reviewed and noted the  following in the patient's chart:   Medical and social history Use of alcohol, tobacco or illicit drugs  Current medications and supplements including opioid prescriptions. Functional ability and status Nutritional status Physical activity Advanced directives List of other physicians Hospitalizations, surgeries, and ER visits in previous 12 months Vitals Screenings to include cognitive, depression, and falls Referrals and appointments  No orders of the defined types were placed in this encounter.  In addition, I have reviewed and discussed with patient certain preventive protocols, quality metrics, and best practice recommendations. A written personalized care plan for preventive services as well as general preventive health recommendations were provided to patient.   Lolita Libra, CMA   08/14/2024   Return in 1 year (on 08/14/2025).  After Visit Summary: (In Person-Printed) AVS printed and given to the patient  Nurse Notes: nothing significant to report

## 2024-08-18 ENCOUNTER — Telehealth: Payer: Self-pay

## 2024-08-18 NOTE — Telephone Encounter (Signed)
 Lipoprotein A still pending.

## 2024-08-18 NOTE — Telephone Encounter (Signed)
 Copied from CRM [id number]. Topic: Clinical - Lab/Test Results >> Aug 18, 2024 11:57 AM Macario HERO wrote: Reason for CRM: Patient calling for lab results.

## 2024-08-19 NOTE — Telephone Encounter (Signed)
 Can you guys check on the Lipoprotein A please?

## 2024-08-20 ENCOUNTER — Telehealth: Payer: Self-pay | Admitting: Family Medicine

## 2024-08-20 LAB — LIPOPROTEIN A (LPA): Lipoprotein (a): <10 nmol/L (ref <75)

## 2024-08-20 NOTE — Telephone Encounter (Signed)
 Advised patient that the test is in process and can take up to 8-14 business days to result.

## 2024-08-20 NOTE — Telephone Encounter (Signed)
 This patient has a lipo protein in process it was sent to [facility] and will be processed and resulted in two to three days

## 2024-08-21 NOTE — Telephone Encounter (Signed)
 Results back, waiting for pcp to result.

## 2024-08-26 ENCOUNTER — Ambulatory Visit: Payer: Self-pay | Admitting: Family Medicine

## 2024-08-29 ENCOUNTER — Other Ambulatory Visit: Payer: Self-pay | Admitting: Family Medicine

## 2024-08-29 DIAGNOSIS — M25569 Pain in unspecified knee: Secondary | ICD-10-CM

## 2025-08-17 ENCOUNTER — Ambulatory Visit
